# Patient Record
Sex: Female | Born: 1940 | Race: Asian | Hispanic: No | Marital: Married
Health system: Midwestern US, Community
[De-identification: ages and names within clinical notes are randomized; demographics above are authoritative.]

## PROBLEM LIST (undated history)

## (undated) ENCOUNTER — Emergency Department (HOSPITAL_BASED_OUTPATIENT_CLINIC_OR_DEPARTMENT_OTHER): Payer: BC Managed Care – PPO

## (undated) DIAGNOSIS — Z86010 Personal history of colonic polyps: Secondary | ICD-10-CM

## (undated) DIAGNOSIS — I700 Atherosclerosis of aorta: Secondary | ICD-10-CM

## (undated) DIAGNOSIS — N3946 Mixed incontinence: Secondary | ICD-10-CM

## (undated) DIAGNOSIS — N952 Postmenopausal atrophic vaginitis: Secondary | ICD-10-CM

## (undated) DIAGNOSIS — Q254 Congenital malformation of aorta unspecified: Secondary | ICD-10-CM

## (undated) DIAGNOSIS — Z8249 Family history of ischemic heart disease and other diseases of the circulatory system: Secondary | ICD-10-CM

## (undated) DIAGNOSIS — Z1231 Encounter for screening mammogram for malignant neoplasm of breast: Secondary | ICD-10-CM

## (undated) DIAGNOSIS — I712 Thoracic aortic aneurysm, without rupture, unspecified: Secondary | ICD-10-CM

## (undated) DIAGNOSIS — K2950 Unspecified chronic gastritis without bleeding: Secondary | ICD-10-CM

## (undated) DIAGNOSIS — M8589 Other specified disorders of bone density and structure, multiple sites: Secondary | ICD-10-CM

## (undated) DIAGNOSIS — K5900 Constipation, unspecified: Secondary | ICD-10-CM

## (undated) HISTORY — DX: Postmenopausal atrophic vaginitis: N95.2

## (undated) HISTORY — DX: Personal history of colonic polyps: Z86.010

## (undated) HISTORY — DX: Atherosclerosis of aorta: I70.0

## (undated) HISTORY — PX: EYE SURGERY: SHX253

## (undated) HISTORY — PX: ABDOMINAL HYSTERECTOMY: SHX81

## (undated) HISTORY — DX: Congenital malformation of aorta unspecified: Q25.40

## (undated) HISTORY — DX: Mixed incontinence: N39.46

## (undated) HISTORY — DX: Family history of ischemic heart disease and other diseases of the circulatory system: Z82.49

## (undated) HISTORY — PX: APPENDECTOMY: SHX54

## (undated) HISTORY — PX: BREAST EXCISIONAL BIOPSY: SUR124

---

## 2012-11-25 ENCOUNTER — Emergency Department (HOSPITAL_BASED_OUTPATIENT_CLINIC_OR_DEPARTMENT_OTHER): Payer: No Typology Code available for payment source

## 2012-11-25 ENCOUNTER — Emergency Department (HOSPITAL_BASED_OUTPATIENT_CLINIC_OR_DEPARTMENT_OTHER)
Admission: EM | Admit: 2012-11-25 | Discharge: 2012-11-26 | Disposition: A | Discharge status: Home / Self Care | Payer: No Typology Code available for payment source | Attending: Emergency Medicine | Admitting: Emergency Medicine

## 2012-11-25 ENCOUNTER — Encounter (HOSPITAL_BASED_OUTPATIENT_CLINIC_OR_DEPARTMENT_OTHER): Payer: Self-pay | Admitting: *Deleted

## 2012-11-25 DIAGNOSIS — Y92410 Unspecified street and highway as the place of occurrence of the external cause: Secondary | ICD-10-CM | POA: Insufficient documentation

## 2012-11-25 DIAGNOSIS — S060XAA Concussion with loss of consciousness status unknown, initial encounter: Secondary | ICD-10-CM | POA: Insufficient documentation

## 2012-11-25 DIAGNOSIS — Y9389 Activity, other specified: Secondary | ICD-10-CM | POA: Insufficient documentation

## 2012-11-25 DIAGNOSIS — S5010XA Contusion of unspecified forearm, initial encounter: Secondary | ICD-10-CM | POA: Insufficient documentation

## 2012-11-25 DIAGNOSIS — S060X9A Concussion with loss of consciousness of unspecified duration, initial encounter: Secondary | ICD-10-CM | POA: Insufficient documentation

## 2012-11-25 DIAGNOSIS — Z880 Allergy status to penicillin: Secondary | ICD-10-CM | POA: Insufficient documentation

## 2012-11-25 LAB — CBC WITH DIFFERENTIAL/PLATELET
Basophils Absolute: 0 10*3/uL (ref 0.0–0.1)
Basophils Relative: 1 % (ref 0–1)
Eosinophils Relative: 3 % (ref 0–5)
HCT: 41.3 % (ref 36.0–46.0)
Lymphocytes Relative: 40 % (ref 12–46)
MCHC: 34.4 g/dL (ref 30.0–36.0)
Monocytes Absolute: 0.4 10*3/uL (ref 0.1–1.0)
Neutro Abs: 2.2 10*3/uL (ref 1.7–7.7)
Platelets: 132 10*3/uL — ABNORMAL LOW (ref 150–400)
RDW: 12.6 % (ref 11.5–15.5)
WBC: 4.6 10*3/uL (ref 4.0–10.5)

## 2012-11-25 LAB — BASIC METABOLIC PANEL
CO2: 28 mEq/L (ref 19–32)
Calcium: 10.2 mg/dL (ref 8.4–10.5)
Chloride: 103 mEq/L (ref 96–112)
Creatinine, Ser: 0.7 mg/dL (ref 0.50–1.10)
GFR calc Af Amer: >90 mL/min (ref >90)
Sodium: 140 mEq/L (ref 135–145)

## 2012-11-25 LAB — URINE MICROSCOPIC-ADD ON

## 2012-11-25 LAB — URINALYSIS, ROUTINE W REFLEX MICROSCOPIC
Bilirubin Urine: NEGATIVE
Glucose, UA: NEGATIVE mg/dL
Hgb urine dipstick: NEGATIVE
Specific Gravity, Urine: 1.01 (ref 1.005–1.030)

## 2012-11-25 NOTE — ED Notes (Signed)
Patient transported to CT 
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
## 2012-11-27 LAB — URINE CULTURE

## 2013-12-29 ENCOUNTER — Other Ambulatory Visit: Payer: Self-pay

## 2013-12-29 DIAGNOSIS — Z1231 Encounter for screening mammogram for malignant neoplasm of breast: Secondary | ICD-10-CM

## 2014-02-21 ENCOUNTER — Encounter (INDEPENDENT_AMBULATORY_CARE_PROVIDER_SITE_OTHER): Payer: Self-pay

## 2014-02-21 ENCOUNTER — Ambulatory Visit
Admission: RE | Admit: 2014-02-21 | Discharge: 2014-02-21 | Disposition: A | Discharge status: Home / Self Care | Payer: Medicare Other | Source: Ambulatory Visit

## 2014-02-21 DIAGNOSIS — Z1231 Encounter for screening mammogram for malignant neoplasm of breast: Secondary | ICD-10-CM

## 2014-09-22 ENCOUNTER — Other Ambulatory Visit: Payer: Self-pay | Admitting: Physical Medicine and Rehabilitation

## 2014-09-22 ENCOUNTER — Ambulatory Visit
Admission: RE | Admit: 2014-09-22 | Discharge: 2014-09-22 | Disposition: A | Discharge status: Home / Self Care | Payer: Medicare Other | Source: Ambulatory Visit | Attending: Family Medicine | Admitting: Family Medicine

## 2014-09-22 ENCOUNTER — Other Ambulatory Visit: Payer: Self-pay | Admitting: Family Medicine

## 2014-09-22 DIAGNOSIS — R05 Cough: Secondary | ICD-10-CM

## 2014-09-22 DIAGNOSIS — R059 Cough, unspecified: Secondary | ICD-10-CM

## 2014-11-16 ENCOUNTER — Other Ambulatory Visit: Payer: Self-pay | Admitting: Family Medicine

## 2014-11-16 DIAGNOSIS — R0989 Other specified symptoms and signs involving the circulatory and respiratory systems: Secondary | ICD-10-CM

## 2014-11-17 ENCOUNTER — Other Ambulatory Visit: Payer: Self-pay | Admitting: Family Medicine

## 2014-11-17 DIAGNOSIS — Z8249 Family history of ischemic heart disease and other diseases of the circulatory system: Secondary | ICD-10-CM

## 2014-11-17 DIAGNOSIS — Z136 Encounter for screening for cardiovascular disorders: Secondary | ICD-10-CM

## 2014-11-25 ENCOUNTER — Ambulatory Visit
Admission: RE | Admit: 2014-11-25 | Discharge: 2014-11-25 | Disposition: A | Discharge status: Home / Self Care | Payer: Medicare Other | Source: Ambulatory Visit | Attending: Family Medicine | Admitting: Family Medicine

## 2014-11-25 DIAGNOSIS — R0989 Other specified symptoms and signs involving the circulatory and respiratory systems: Secondary | ICD-10-CM

## 2014-11-25 DIAGNOSIS — Z8249 Family history of ischemic heart disease and other diseases of the circulatory system: Secondary | ICD-10-CM

## 2014-11-25 DIAGNOSIS — Z136 Encounter for screening for cardiovascular disorders: Secondary | ICD-10-CM

## 2015-01-18 ENCOUNTER — Other Ambulatory Visit: Payer: Self-pay

## 2015-01-18 DIAGNOSIS — Z1231 Encounter for screening mammogram for malignant neoplasm of breast: Secondary | ICD-10-CM

## 2015-02-27 ENCOUNTER — Ambulatory Visit
Admission: RE | Admit: 2015-02-27 | Discharge: 2015-02-27 | Disposition: A | Discharge status: Home / Self Care | Payer: Medicare Other | Source: Ambulatory Visit

## 2015-02-27 DIAGNOSIS — Z1231 Encounter for screening mammogram for malignant neoplasm of breast: Secondary | ICD-10-CM

## 2015-09-29 DIAGNOSIS — H524 Presbyopia: Secondary | ICD-10-CM | POA: Diagnosis not present

## 2015-11-13 DIAGNOSIS — H5442 Blindness, left eye, normal vision right eye: Secondary | ICD-10-CM | POA: Diagnosis not present

## 2015-11-13 DIAGNOSIS — H40032 Anatomical narrow angle, left eye: Secondary | ICD-10-CM | POA: Diagnosis not present

## 2015-11-13 DIAGNOSIS — H40001 Preglaucoma, unspecified, right eye: Secondary | ICD-10-CM | POA: Diagnosis not present

## 2015-11-14 DIAGNOSIS — Z0000 Encounter for general adult medical examination without abnormal findings: Secondary | ICD-10-CM | POA: Diagnosis not present

## 2015-11-14 DIAGNOSIS — Z1389 Encounter for screening for other disorder: Secondary | ICD-10-CM | POA: Diagnosis not present

## 2015-11-14 DIAGNOSIS — Z86010 Personal history of colonic polyps: Secondary | ICD-10-CM | POA: Diagnosis not present

## 2016-01-16 DIAGNOSIS — K648 Other hemorrhoids: Secondary | ICD-10-CM | POA: Diagnosis not present

## 2016-01-16 DIAGNOSIS — Z86010 Personal history of colonic polyps: Secondary | ICD-10-CM | POA: Diagnosis not present

## 2016-01-16 DIAGNOSIS — D126 Benign neoplasm of colon, unspecified: Secondary | ICD-10-CM | POA: Diagnosis not present

## 2016-01-16 DIAGNOSIS — D125 Benign neoplasm of sigmoid colon: Secondary | ICD-10-CM | POA: Diagnosis not present

## 2016-02-12 ENCOUNTER — Other Ambulatory Visit: Payer: Self-pay | Admitting: Obstetrics and Gynecology

## 2016-02-12 DIAGNOSIS — Z1231 Encounter for screening mammogram for malignant neoplasm of breast: Secondary | ICD-10-CM

## 2016-02-13 DIAGNOSIS — M545 Low back pain: Secondary | ICD-10-CM | POA: Diagnosis not present

## 2016-02-13 DIAGNOSIS — S40029A Contusion of unspecified upper arm, initial encounter: Secondary | ICD-10-CM | POA: Diagnosis not present

## 2016-02-13 DIAGNOSIS — Z8249 Family history of ischemic heart disease and other diseases of the circulatory system: Secondary | ICD-10-CM | POA: Diagnosis not present

## 2016-03-11 ENCOUNTER — Ambulatory Visit: Payer: Medicare Other

## 2016-03-15 DIAGNOSIS — R300 Dysuria: Secondary | ICD-10-CM | POA: Diagnosis not present

## 2016-03-18 ENCOUNTER — Ambulatory Visit
Admission: RE | Admit: 2016-03-18 | Discharge: 2016-03-18 | Disposition: A | Discharge status: Home / Self Care | Payer: Medicare Other | Source: Ambulatory Visit | Attending: Obstetrics and Gynecology | Admitting: Obstetrics and Gynecology

## 2016-03-18 DIAGNOSIS — Z1231 Encounter for screening mammogram for malignant neoplasm of breast: Secondary | ICD-10-CM

## 2016-04-09 DIAGNOSIS — Z23 Encounter for immunization: Secondary | ICD-10-CM | POA: Diagnosis not present

## 2016-04-15 DIAGNOSIS — M25551 Pain in right hip: Secondary | ICD-10-CM | POA: Diagnosis not present

## 2016-04-19 DIAGNOSIS — M461 Sacroiliitis, not elsewhere classified: Secondary | ICD-10-CM | POA: Diagnosis not present

## 2016-05-28 DIAGNOSIS — R300 Dysuria: Secondary | ICD-10-CM | POA: Diagnosis not present

## 2016-05-28 DIAGNOSIS — N952 Postmenopausal atrophic vaginitis: Secondary | ICD-10-CM | POA: Diagnosis not present

## 2016-05-28 DIAGNOSIS — N3946 Mixed incontinence: Secondary | ICD-10-CM | POA: Diagnosis not present

## 2016-08-07 DIAGNOSIS — M461 Sacroiliitis, not elsewhere classified: Secondary | ICD-10-CM | POA: Diagnosis not present

## 2016-08-09 DIAGNOSIS — J069 Acute upper respiratory infection, unspecified: Secondary | ICD-10-CM | POA: Diagnosis not present

## 2016-08-13 DIAGNOSIS — J0100 Acute maxillary sinusitis, unspecified: Secondary | ICD-10-CM | POA: Diagnosis not present

## 2016-08-30 DIAGNOSIS — H40001 Preglaucoma, unspecified, right eye: Secondary | ICD-10-CM | POA: Diagnosis not present

## 2016-08-30 DIAGNOSIS — H40032 Anatomical narrow angle, left eye: Secondary | ICD-10-CM | POA: Diagnosis not present

## 2016-08-30 DIAGNOSIS — H5440 Blindness, one eye, unspecified eye: Secondary | ICD-10-CM | POA: Diagnosis not present

## 2016-08-30 DIAGNOSIS — H2513 Age-related nuclear cataract, bilateral: Secondary | ICD-10-CM | POA: Diagnosis not present

## 2016-09-11 DIAGNOSIS — M461 Sacroiliitis, not elsewhere classified: Secondary | ICD-10-CM | POA: Diagnosis not present

## 2016-09-23 DIAGNOSIS — R0989 Other specified symptoms and signs involving the circulatory and respiratory systems: Secondary | ICD-10-CM | POA: Diagnosis not present

## 2016-09-27 DIAGNOSIS — M5416 Radiculopathy, lumbar region: Secondary | ICD-10-CM | POA: Diagnosis not present

## 2016-10-02 DIAGNOSIS — H524 Presbyopia: Secondary | ICD-10-CM | POA: Diagnosis not present

## 2016-10-08 ENCOUNTER — Telehealth: Payer: Self-pay

## 2016-10-08 NOTE — Telephone Encounter (Signed)
NOTES SENT TO SCHEDULING.  °

## 2016-10-09 ENCOUNTER — Encounter (INDEPENDENT_AMBULATORY_CARE_PROVIDER_SITE_OTHER): Payer: Self-pay

## 2016-10-09 ENCOUNTER — Encounter: Payer: Self-pay | Admitting: Cardiovascular Disease

## 2016-10-09 ENCOUNTER — Other Ambulatory Visit: Payer: Self-pay | Admitting: Cardiovascular Disease

## 2016-10-09 ENCOUNTER — Ambulatory Visit (INDEPENDENT_AMBULATORY_CARE_PROVIDER_SITE_OTHER): Payer: Medicare Other | Admitting: Cardiovascular Disease

## 2016-10-09 VITALS — BP 120/70 | HR 61 | Ht 62.0 in | Wt 127.8 lb

## 2016-10-09 DIAGNOSIS — Z7689 Persons encountering health services in other specified circumstances: Secondary | ICD-10-CM

## 2016-10-09 DIAGNOSIS — I719 Aortic aneurysm of unspecified site, without rupture: Secondary | ICD-10-CM

## 2016-10-09 DIAGNOSIS — R0602 Shortness of breath: Secondary | ICD-10-CM | POA: Diagnosis not present

## 2016-10-09 DIAGNOSIS — Z79899 Other long term (current) drug therapy: Secondary | ICD-10-CM | POA: Diagnosis not present

## 2016-10-09 LAB — BASIC METABOLIC PANEL
BUN/Creatinine Ratio: 28 (ref 12–28)
BUN: 18 mg/dL (ref 8–27)
CO2: 24 mmol/L (ref 18–29)
CREATININE: 0.65 mg/dL (ref 0.57–1.00)
Calcium: 9.4 mg/dL (ref 8.7–10.3)
Chloride: 100 mmol/L (ref 96–106)
GFR calc Af Amer: 98 mL/min/{1.73_m2} (ref >59)
GFR calc non Af Amer: 85 mL/min/{1.73_m2} (ref >59)
Glucose: 89 mg/dL (ref 65–99)
Potassium: 4.6 mmol/L (ref 3.5–5.2)
SODIUM: 141 mmol/L (ref 134–144)

## 2016-10-09 NOTE — Patient Instructions (Addendum)
Medication Instructions:  Your physician recommends that you continue on your current medications as directed. Please refer to the Current Medication list given to you today.  Labwork: Your physician recommends that you have lab work today- BMET  Testing/Procedures: Your physician has requested that you have an echocardiogram. Echocardiography is a painless test that uses sound waves to create images of your heart. It provides your doctor with information about the size and shape of your heart and how well your heart's chambers and valves are working. This procedure takes approximately one hour. There are no restrictions for this procedure.  Your physician has requested that you have an abdominal aorta duplex. During this test, an ultrasound is used to evaluate the aorta. Allow 30 minutes for this exam. Do not eat after midnight the day before and avoid carbonated beverages  Cardiac CT scanning, (CAT scanning), is a noninvasive, special x-ray that produces cross-sectional images of the body using x-rays and a computer. CT scans help physicians diagnose and treat medical conditions. For some CT exams, a contrast material is used to enhance visibility in the area of the body being studied. CT scans provide greater clarity and reveal more details than regular x-ray exams.  Follow-Up: Your physician wants you to follow-up in: 6 months with Dr. Eden EmmsNishan. You will receive a reminder letter in the mail two months in advance. If you don't receive a letter, please call our office to schedule the follow-up appointment.   If you need a refill on your cardiac medications before your next appointment, please call your pharmacy.    1``

## 2016-10-09 NOTE — Progress Notes (Signed)
Cardiology Office Note   Date:  10/09/2016   ID:  Allison CrewsMyung J Riemenschneider, DOB 1941/03/10, MRN [id number]  PCP:  [facility] Dr Zachery DauerBarnes   Cardiologist:   Charlton HawsPeter Thaine Garriga, MD   Chief Complaint  Patient presents with  . Establish Care      History of Present Illness: Allison Johnston is a 76 y.o. female who presents for evaluation of aorta. CXR with aortic arch calcification Family history of aortic aneurysm. CXR Done for dyspnea. Also showed ? COPD She indicates having two sisters with ? AAA one  Got a stent. She is a non smoker with no known history of vascular disease. She is active Goes to [employer] and exercises/swims daily.  No chest pain palpitations or syncope  She had a bout of MAI Rx with antibiotics with chronic sputum and cough.   Some exertional dyspnea no worsening over last year    Past Medical History:  Diagnosis Date  . Aortic arch anomaly   . Family history of thoracic aortic aneurysm   . 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 Hx of colonic polyps   . Mixed stress and urge urinary incontinence   . Vaginal atrophy     Past Surgical History:  Procedure Laterality Date  . ABDOMINAL HYSTERECTOMY    . APPENDECTOMY    . EYE SURGERY       Current Outpatient Prescriptions  Medication Sig Dispense Refill  . Calcium Carbonate-Vitamin D 600-200 MG-UNIT CAPS Take 1 tablet by mouth 2 (two) times daily.    . cevimeline (EVOXAC) 30 MG capsule Take 30 mg by mouth 3 (three) times daily.    . Cholecalciferol (VITAMIN D) 2000 units CAPS Take 2,000 Units by mouth daily.    . Estradiol 10 MCG TABS vaginal tablet Place vaginally 2 (two) times a week.    Marland Kitchen. FIBER PO Take by mouth daily.    . Flaxseed, Linseed, (FLAX SEED OIL PO) Take by mouth 2 (two) times daily.    . Multiple Vitamin (MULTIVITAMIN) tablet Take 1 tablet by mouth daily.    . Multiple Vitamins-Minerals (HAIR SKIN AND NAILS FORMULA PO) Take by mouth 2 (two) times daily.    . Omega-3 Fatty  Acids (FISH OIL) 1000 MG CAPS Take 2,000 mcg by mouth 2 (two) times daily.     No current facility-administered medications for this visit.     Allergies:   Penicillins; Azithromycin; Amoxicillin; Erythromycin; and Sulfa antibiotics    Social History:  The patient  reports that she has never smoked. She has never used smokeless tobacco. She reports that she does not drink alcohol or use drugs.   Family History:  The patient's family history is not on file.    ROS:  Please see the history of present illness.   Otherwise, review of systems are positive for none.   All other systems are reviewed and negative.    PHYSICAL EXAM: VS:  BP 120/70   Pulse 61   Ht 5\' 2"  (1.575 m)   Wt 127 lb 12.8 oz (58 kg)   SpO2 97%   BMI 23.37 kg/m  , BMI Body mass index is 23.37 kg/m. Affect appropriate Healthy:  appears stated age HEENT: normal Neck supple with no adenopathy JVP normal no bruits no thyromegaly Lungs clear with no wheezing and good diaphragmatic motion Heart:  S1/S2 no murmur, no rub, gallop or click PMI normal Abdomen: benighn, BS positve,  no tenderness,  Abdominal aorta palpable not tender  no bruit.  No HSM or HJR Distal pulses intact with no bruits No edema Neuro non-focal Skin warm and dry No muscular weakness    EKG:  SR rate 56 normal ECG    Recent Labs: No results found for requested labs within last 8760 hours.    Lipid Panel No results found for: CHOL, TRIG, HDL, CHOLHDL, VLDL, LDLCALC, LDLDIRECT    Wt Readings from Last 3 Encounters:  10/09/16 127 lb 12.8 oz (58 kg)  11/25/12 120 lb (54.4 kg)      Other studies Reviewed: Additional studies/ records that were reviewed today include: Notes from Dr Zachery Dauer CXR .    ASSESSMENT AND PLAN:  1. MAI:  CXR with ? COPD active no wheezing cough related f/u primary 2. Family history Aneurysm : with calcification on CXR will order chest CTA And abdominal US to look at aorta. Calcium may be just related to  age 11. Edema:  Mild dependent low sodium diet elevated at end of day  4. Dyspnea:  F/u echo suspect due to previous chronic lung infection   Current medicines are reviewed at length with the patient today.  The patient does not have concerns regarding medicines.  The following changes have been made:  no change  Labs/ tests ordered today include: CTA Chest, Abdominal US Echo  No orders of the defined types were placed in this encounter.    Disposition:   FU with in a year      Signed, Charlton Haws, MD  10/09/2016 11:29 AM    [facility] [address] [place], [place], [place]  [address] Phone: [phone]; Fax: [phone]

## 2016-10-11 ENCOUNTER — Ambulatory Visit (INDEPENDENT_AMBULATORY_CARE_PROVIDER_SITE_OTHER)
Admission: RE | Admit: 2016-10-11 | Discharge: 2016-10-11 | Disposition: A | Discharge status: Home / Self Care | Payer: Medicare Other | Source: Ambulatory Visit | Attending: Cardiovascular Disease | Admitting: Cardiovascular Disease

## 2016-10-11 DIAGNOSIS — I719 Aortic aneurysm of unspecified site, without rupture: Secondary | ICD-10-CM

## 2016-10-11 DIAGNOSIS — R079 Chest pain, unspecified: Secondary | ICD-10-CM | POA: Diagnosis not present

## 2016-10-11 MED ORDER — IOPAMIDOL (ISOVUE-370) INJECTION 76%
75.0000 mL | Freq: Once | INTRAVENOUS | Status: AC | PRN
  Administered 2016-10-11: 75 mL via INTRAVENOUS

## 2016-10-15 DIAGNOSIS — M5416 Radiculopathy, lumbar region: Secondary | ICD-10-CM | POA: Diagnosis not present

## 2016-10-24 ENCOUNTER — Ambulatory Visit (HOSPITAL_COMMUNITY): Payer: Medicare Other | Attending: Cardiovascular Disease

## 2016-10-24 ENCOUNTER — Other Ambulatory Visit: Payer: Self-pay

## 2016-10-24 DIAGNOSIS — R600 Localized edema: Secondary | ICD-10-CM | POA: Insufficient documentation

## 2016-10-24 DIAGNOSIS — I351 Nonrheumatic aortic (valve) insufficiency: Secondary | ICD-10-CM | POA: Insufficient documentation

## 2016-10-24 DIAGNOSIS — R0602 Shortness of breath: Secondary | ICD-10-CM

## 2016-10-24 DIAGNOSIS — I719 Aortic aneurysm of unspecified site, without rupture: Secondary | ICD-10-CM

## 2016-11-06 ENCOUNTER — Inpatient Hospital Stay (HOSPITAL_COMMUNITY): Admission: RE | Admit: 2016-11-06 | Payer: Medicare Other | Source: Ambulatory Visit

## 2016-11-13 DIAGNOSIS — Z136 Encounter for screening for cardiovascular disorders: Secondary | ICD-10-CM | POA: Diagnosis not present

## 2016-11-13 DIAGNOSIS — Z0000 Encounter for general adult medical examination without abnormal findings: Secondary | ICD-10-CM | POA: Diagnosis not present

## 2016-11-15 DIAGNOSIS — Z1389 Encounter for screening for other disorder: Secondary | ICD-10-CM | POA: Diagnosis not present

## 2016-11-15 DIAGNOSIS — Z0000 Encounter for general adult medical examination without abnormal findings: Secondary | ICD-10-CM | POA: Diagnosis not present

## 2016-11-15 DIAGNOSIS — Z8249 Family history of ischemic heart disease and other diseases of the circulatory system: Secondary | ICD-10-CM | POA: Diagnosis not present

## 2016-11-15 DIAGNOSIS — M8588 Other specified disorders of bone density and structure, other site: Secondary | ICD-10-CM | POA: Diagnosis not present

## 2016-11-16 DIAGNOSIS — M5416 Radiculopathy, lumbar region: Secondary | ICD-10-CM | POA: Diagnosis not present

## 2016-11-21 DIAGNOSIS — M5416 Radiculopathy, lumbar region: Secondary | ICD-10-CM | POA: Diagnosis not present

## 2016-11-29 ENCOUNTER — Ambulatory Visit (HOSPITAL_COMMUNITY)
Admission: RE | Admit: 2016-11-29 | Discharge: 2016-11-29 | Disposition: A | Discharge status: Home / Self Care | Payer: Medicare Other | Source: Ambulatory Visit | Attending: Cardiovascular Disease | Admitting: Cardiovascular Disease

## 2016-11-29 DIAGNOSIS — I719 Aortic aneurysm of unspecified site, without rupture: Secondary | ICD-10-CM | POA: Insufficient documentation

## 2016-11-29 DIAGNOSIS — Z8249 Family history of ischemic heart disease and other diseases of the circulatory system: Secondary | ICD-10-CM | POA: Diagnosis not present

## 2016-11-29 DIAGNOSIS — I714 Abdominal aortic aneurysm, without rupture: Secondary | ICD-10-CM | POA: Diagnosis not present

## 2016-12-10 ENCOUNTER — Telehealth: Payer: Self-pay | Admitting: Cardiovascular Disease

## 2016-12-10 DIAGNOSIS — M5416 Radiculopathy, lumbar region: Secondary | ICD-10-CM | POA: Diagnosis not present

## 2016-12-10 NOTE — Telephone Encounter (Signed)
New message    Pt calling back for testing results

## 2016-12-10 NOTE — Telephone Encounter (Signed)
Called patient back with AAA results.

## 2017-02-07 ENCOUNTER — Other Ambulatory Visit: Payer: Self-pay | Admitting: Obstetrics and Gynecology

## 2017-02-07 DIAGNOSIS — Z1231 Encounter for screening mammogram for malignant neoplasm of breast: Secondary | ICD-10-CM

## 2017-02-19 DIAGNOSIS — M5416 Radiculopathy, lumbar region: Secondary | ICD-10-CM | POA: Diagnosis not present

## 2017-03-03 DIAGNOSIS — M8588 Other specified disorders of bone density and structure, other site: Secondary | ICD-10-CM | POA: Diagnosis not present

## 2017-03-19 ENCOUNTER — Ambulatory Visit
Admission: RE | Admit: 2017-03-19 | Discharge: 2017-03-19 | Disposition: A | Discharge status: Home / Self Care | Payer: Medicare Other | Source: Ambulatory Visit | Attending: Obstetrics and Gynecology | Admitting: Obstetrics and Gynecology

## 2017-03-19 DIAGNOSIS — Z1231 Encounter for screening mammogram for malignant neoplasm of breast: Secondary | ICD-10-CM | POA: Diagnosis not present

## 2017-04-15 ENCOUNTER — Ambulatory Visit: Payer: Medicare Other

## 2017-04-15 DIAGNOSIS — Z23 Encounter for immunization: Secondary | ICD-10-CM | POA: Diagnosis not present

## 2017-05-20 NOTE — Progress Notes (Signed)
Cardiology Office Note   Date:  05/28/2017   ID:  Allison Johnston, Allison Johnston 1941/05/25, MRN [id number]  PCP:  [facility] Dr Zachery Dauer   Cardiologist:   Charlton Haws, MD   No chief complaint on file.     History of Present Illness: Allison Johnston is a 76 y.o. female with family history of aneurysmal disease and chronic lung disease She indicates having two sisters with ? AAA one Got a stent. She is a non smoker with no known history of vascular disease. She is active Goes to [employer] and exercises/swims daily.  No chest pain palpitations or syncope   She had a bout of MAI Rx with antibiotics with chronic sputum and cough.   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 Has two children daughter in [place] And son MD in [place] with two grandchildren     Past Medical History:  Diagnosis Date  . Aortic arch anomaly   . Family history of thoracic aortic aneurysm   . 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 Hx of colonic polyps   . Mixed stress and urge urinary incontinence   . Vaginal atrophy     Past Surgical History:  Procedure Laterality Date  . ABDOMINAL HYSTERECTOMY    . APPENDECTOMY    . EYE SURGERY       Current Outpatient Prescriptions  Medication Sig Dispense Refill  . Calcium Carbonate-Vitamin D 600-200 MG-UNIT CAPS Take 1 tablet by mouth 2 (two) times daily.    . cevimeline (EVOXAC) 30 MG capsule Take 30 mg by mouth 3 (three) times daily.    . Cholecalciferol (VITAMIN D) 2000 units CAPS Take 2,000 Units by mouth daily.    . Estradiol 10 MCG TABS vaginal tablet Place vaginally 2 (two) times a week.    Marland Kitchen FIBER PO Take by mouth daily.    . Flaxseed, Linseed, (FLAX SEED OIL PO) Take by mouth 2 (two) times daily.    . Multiple Vitamin (MULTIVITAMIN) tablet Take 1 tablet  by mouth daily.    . Multiple Vitamins-Minerals (HAIR SKIN AND NAILS FORMULA PO) Take by mouth 2 (two) times daily.    . Omega-3 Fatty Acids (FISH OIL) 1000 MG CAPS Take 2,000 mcg by mouth 2 (two) times daily.    . Turmeric 500 MG CAPS Take 500 mg by mouth daily.     No current facility-administered medications for this visit.     Allergies:   Penicillins; Azithromycin; Amoxicillin; Erythromycin; and Sulfa antibiotics    Social History:  The patient  reports that she has never smoked. She has never used smokeless tobacco. She reports that she does not drink alcohol or use drugs.   Family History:  The patient's family history is not on file.    ROS:  Please see the history of present illness.   Otherwise, review of systems are positive for none.   All other systems are reviewed and negative.    PHYSICAL EXAM: VS:  BP 122/64   Pulse (!) 59   Ht  (1.549 m)   Wt 124 lb 8 oz (56.5 kg)   BMI 23.52 kg/m  , BMI Body mass index is 23.52 kg/m. Affect appropriate Healthy:  appears  stated age HEENT: normal Neck supple with no adenopathy JVP normal no bruits no thyromegaly Lungs clear with no wheezing and good diaphragmatic motion Heart:  S1/S2 no murmur, no rub, gallop or click PMI normal Abdomen: benighn, BS positve, no tenderness,  Abdominal aorta palpable not tender  no bruit.  No HSM or HJR Distal pulses intact with no bruits No edema Neuro non-focal Skin warm and dry No muscular weakness    EKG:  SR rate 56 normal ECG    Recent Labs: 10/09/2016: BUN 18; Creatinine, Ser 0.65; Potassium 4.6; Sodium 141    Lipid Panel No results found for: CHOL, TRIG, HDL, CHOLHDL, VLDL, LDLCALC, LDLDIRECT    Wt Readings from Last 3 Encounters:  05/28/17 124 lb 8 oz (56.5 kg)  10/09/16 127 lb 12.8 oz (58 kg)  11/25/12 120 lb (54.4 kg)      Other studies Reviewed: Additional studies/ records that were reviewed today include: Notes from Dr Zachery Dauer CXR .    ASSESSMENT AND  PLAN:  1. MAI:  CXR with ? COPD active no wheezing cough related f/u primary 2. Family history Aneurysm :  CTA 10/11/16 Ao 4.0 cm no AAA by duplex  And abdominal US to look at aorta. Calcium may be just related to age 47. Edema:  Mild dependent low sodium diet elevated at end of day  4. Dyspnea:  Related to lung disease echo 10/24/16 EF normal  5. AR: mild not evident on exam observe    Current medicines are reviewed at length with the patient today.  The patient does not have concerns regarding medicines.  The following changes have been made:  no change  Labs/ tests ordered today include: CTA Chest, Abdominal US Echo  No orders of the defined types were placed in this encounter.    Disposition:   FU with in a year      Signed, Charlton Haws, MD  05/28/2017 11:28 AM    [facility] [address] [place], [place], [place]  [address] Phone: [phone]; Fax: [phone]

## 2017-05-27 DIAGNOSIS — N952 Postmenopausal atrophic vaginitis: Secondary | ICD-10-CM | POA: Diagnosis not present

## 2017-05-27 DIAGNOSIS — Z01411 Encounter for gynecological examination (general) (routine) with abnormal findings: Secondary | ICD-10-CM | POA: Diagnosis not present

## 2017-05-28 ENCOUNTER — Encounter: Payer: Self-pay | Admitting: Cardiovascular Disease

## 2017-05-28 ENCOUNTER — Ambulatory Visit (INDEPENDENT_AMBULATORY_CARE_PROVIDER_SITE_OTHER): Payer: Medicare Other | Admitting: Cardiovascular Disease

## 2017-05-28 VITALS — BP 122/64 | HR 59 | Ht 61.0 in | Wt 124.5 lb

## 2017-05-28 DIAGNOSIS — I719 Aortic aneurysm of unspecified site, without rupture: Secondary | ICD-10-CM | POA: Diagnosis not present

## 2017-05-28 NOTE — Patient Instructions (Addendum)
Medication Instructions:  Your physician recommends that you continue on your current medications as directed. Please refer to the Current Medication list given to you today.  Labwork: Your physician recommends that you return for lab work in: March for a BMET before CT test   Testing/Procedures: Your physician has requested that you have cardiac CT in March. Cardiac computed tomography (CT) is a painless test that uses an x-ray machine to take clear, detailed pictures of your heart. For further information please visit [email or web address]. Please follow instruction sheet as given.  Follow-Up: Your physician wants you to follow-up in: March with Dr. Eden EmmsNishan after CT. You will receive a reminder letter in the mail two months in advance. If you don't receive a letter, please call our office to schedule the follow-up appointment.   If you need a refill on your cardiac medications before your next appointment, please call your pharmacy.  Please [facility] the [facility] at xx:xx AM (30-45 minutes prior to test start time)  [facility] [address] [place], [place] [address] [phone]  Proceed to the [facility] Radiology Department (First Floor).  Please follow these instructions carefully (unless otherwise directed):  Hold all erectile dysfunction medications at least 48 hours prior to test.  On the Night Before the Test: . Drink plenty of water. . Do not consume any caffeinated/decaffeinated beverages or chocolate 12 hours prior to your test. . Do not take any antihistamines 12 hours prior to your test.  On the Day of the Test: . Drink plenty of water. Do not drink any water within one hour of the test. . Do not eat any food 4 hours prior to the test. . You may take your regular medications prior to the test.  After the Test: . Drink plenty of water. . After receiving IV contrast, you may experience a mild flushed feeling.  This is normal. . On occasion, you may experience a mild rash up to 24 hours after the test. This is not dangerous. If this occurs, you can take Benadryl 25 mg and increase your fluid intake. . If you experience trouble breathing, this can be serious. If it is severe call 911 IMMEDIATELY. If it is mild, please call our office.

## 2017-07-03 ENCOUNTER — Encounter: Payer: Self-pay | Admitting: Cardiovascular Disease

## 2017-08-06 ENCOUNTER — Telehealth: Payer: Self-pay

## 2017-08-06 ENCOUNTER — Other Ambulatory Visit: Payer: Self-pay

## 2017-08-06 DIAGNOSIS — I719 Aortic aneurysm of unspecified site, without rupture: Secondary | ICD-10-CM

## 2017-08-06 DIAGNOSIS — I714 Abdominal aortic aneurysm, without rupture, unspecified: Secondary | ICD-10-CM

## 2017-08-06 NOTE — Addendum Note (Signed)
Addended by: Virl AxePATE INGALLS, Magan Winnett L on: 08/06/2017 03:08 PM   Modules accepted: Orders

## 2017-08-06 NOTE — Progress Notes (Signed)
Patient needs order for CTA for March prior to her appt. Ordered test. Will send message to schedule to help with appointment times.

## 2017-08-06 NOTE — Telephone Encounter (Signed)
Patient needs order for CTA for March prior to her appt. Ordered test. Will send message to schedule to help with appointment times. Left message for patient to call back.

## 2017-08-07 DIAGNOSIS — I719 Aortic aneurysm of unspecified site, without rupture: Secondary | ICD-10-CM | POA: Insufficient documentation

## 2017-08-11 DIAGNOSIS — Z23 Encounter for immunization: Secondary | ICD-10-CM | POA: Diagnosis not present

## 2017-08-11 DIAGNOSIS — L989 Disorder of the skin and subcutaneous tissue, unspecified: Secondary | ICD-10-CM | POA: Diagnosis not present

## 2017-08-11 DIAGNOSIS — L853 Xerosis cutis: Secondary | ICD-10-CM | POA: Diagnosis not present

## 2017-08-13 ENCOUNTER — Other Ambulatory Visit: Payer: Medicare Other

## 2017-08-13 DIAGNOSIS — H6123 Impacted cerumen, bilateral: Secondary | ICD-10-CM | POA: Diagnosis not present

## 2017-08-18 ENCOUNTER — Ambulatory Visit (HOSPITAL_COMMUNITY): Payer: Medicare Other

## 2017-08-19 NOTE — Telephone Encounter (Signed)
Patient has appt in March for BMET, CT, and office visit.

## 2017-09-01 DIAGNOSIS — J040 Acute laryngitis: Secondary | ICD-10-CM | POA: Diagnosis not present

## 2017-09-01 DIAGNOSIS — L309 Dermatitis, unspecified: Secondary | ICD-10-CM | POA: Diagnosis not present

## 2017-09-29 ENCOUNTER — Ambulatory Visit: Payer: Medicare Other | Admitting: Cardiovascular Disease

## 2017-10-03 DIAGNOSIS — H52221 Regular astigmatism, right eye: Secondary | ICD-10-CM | POA: Diagnosis not present

## 2017-10-09 ENCOUNTER — Other Ambulatory Visit: Payer: Medicare Other

## 2017-10-09 ENCOUNTER — Other Ambulatory Visit: Payer: Self-pay

## 2017-10-09 DIAGNOSIS — I719 Aortic aneurysm of unspecified site, without rupture: Secondary | ICD-10-CM

## 2017-10-10 LAB — BASIC METABOLIC PANEL
BUN/Creatinine Ratio: 25 (ref 12–28)
BUN: 18 mg/dL (ref 8–27)
CO2: 27 mmol/L (ref 20–29)
CREATININE: 0.72 mg/dL (ref 0.57–1.00)
Calcium: 9.5 mg/dL (ref 8.7–10.3)
Chloride: 102 mmol/L (ref 96–106)
GFR calc Af Amer: 92 mL/min/{1.73_m2} (ref >59)
GFR calc non Af Amer: 80 mL/min/{1.73_m2} (ref >59)
GLUCOSE: 84 mg/dL (ref 65–99)
Potassium: 4.4 mmol/L (ref 3.5–5.2)
Sodium: 142 mmol/L (ref 134–144)

## 2017-10-16 ENCOUNTER — Ambulatory Visit (INDEPENDENT_AMBULATORY_CARE_PROVIDER_SITE_OTHER)
Admission: RE | Admit: 2017-10-16 | Discharge: 2017-10-16 | Disposition: A | Discharge status: Home / Self Care | Payer: Medicare Other | Source: Ambulatory Visit | Attending: Cardiovascular Disease | Admitting: Cardiovascular Disease

## 2017-10-16 DIAGNOSIS — I700 Atherosclerosis of aorta: Secondary | ICD-10-CM | POA: Diagnosis not present

## 2017-10-16 DIAGNOSIS — I719 Aortic aneurysm of unspecified site, without rupture: Secondary | ICD-10-CM

## 2017-10-16 DIAGNOSIS — I712 Thoracic aortic aneurysm, without rupture: Secondary | ICD-10-CM | POA: Diagnosis not present

## 2017-10-16 MED ORDER — IOPAMIDOL (ISOVUE-370) INJECTION 76%
100.0000 mL | Freq: Once | INTRAVENOUS | Status: AC | PRN
  Administered 2017-10-16: 100 mL via INTRAVENOUS

## 2017-10-18 NOTE — Progress Notes (Signed)
Cardiology Office Note   Date:  10/20/2017   ID:  Allison Johnston, DOB [birth year], MRN [id number]  PCP:  [facility] Dr Allison Johnston   Cardiologist:   Charlton Haws, MD   No chief complaint on file.     History of Present Illness:  77 y.o. familial history of aneurysmal disease Chronic lung disease Two sisters with AAA one got stented. 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 Has two children daughter in [place] And son MD in [place] with two grandchildren   No cardiac complaints   Past Medical History:  Diagnosis Date  . Aortic arch anomaly   . Family history of thoracic aortic aneurysm   . 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 Hx of colonic polyps   . Mixed stress and urge urinary incontinence   . Vaginal atrophy     Past Surgical History:  Procedure Laterality Date  . ABDOMINAL HYSTERECTOMY    . APPENDECTOMY    . EYE SURGERY       Current Outpatient Medications  Medication Sig Dispense Refill  . Calcium Carbonate-Vitamin D 600-200 MG-UNIT CAPS Take 1 tablet by mouth 2 (two) times daily.    . Cholecalciferol (VITAMIN D) 2000 units CAPS Take 2,000 Units by mouth daily.    . Estradiol 10 MCG TABS vaginal tablet Place vaginally 2 (two) times a week.    Marland Kitchen FIBER PO Take by mouth as directed.     . Flaxseed, Linseed, (FLAX SEED OIL PO) Take by mouth 2 (two) times daily.    . Multiple Vitamin (MULTIVITAMIN) tablet Take 1 tablet by mouth daily.    . Multiple Vitamins-Minerals (HAIR SKIN AND NAILS FORMULA PO) Take by mouth 2 (two) times daily.    . Omega-3 Fatty Acids (FISH OIL) 1000 MG CAPS Take 2,000 mcg by mouth 2 (two) times daily.    . Turmeric 500 MG CAPS Take 500 mg by mouth  daily.     No current facility-administered medications for this visit.     Allergies:   Penicillins; Azithromycin; Amoxicillin; Erythromycin; and Sulfa antibiotics    Social History:  The patient  reports that  has never smoked. she has never used smokeless tobacco. She reports that she does not drink alcohol or use drugs.   Family History:  The patient's family history is not on file.    ROS:  Please see the history of present illness.   Otherwise, review of systems are positive for none.   All other systems are reviewed and negative.    PHYSICAL EXAM: BP 118/70   Ht 5\' 2"  (1.575 m)   Wt 125 lb 8 oz (56.9 kg)   BMI 22.95 kg/m  Affect appropriate Thin elderly chinese female  HEENT: normal Neck supple with no adenopathy JVP normal no bruits no thyromegaly Lungs clear with no wheezing and good diaphragmatic motion Heart:  S1/S2 no murmur, no rub, gallop or click PMI normal Abdomen: benighn, BS positve, no tenderness, no AAA no bruit.  No HSM or HJR Distal pulses intact with no bruits No edema Neuro non-focal Skin warm and dry No muscular weakness    EKG:  SR rate 56 normal ECG    Recent Labs: 10/09/2017: BUN 18; Creatinine, Ser 0.72; Potassium 4.4; Sodium 142    Lipid Panel No results found for: CHOL, TRIG, HDL, CHOLHDL, VLDL, LDLCALC, LDLDIRECT    Wt Readings from Last 3 Encounters:  10/20/17 125 lb 8 oz (56.9 kg)  05/28/17 124 lb 8 oz (56.5 kg)  10/09/16 127 lb 12.8 oz (58 kg)      Other studies Reviewed: Additional studies/ records that were reviewed today include: Notes from Dr Allison DauerBarnes CXR .    ASSESSMENT AND PLAN:  1. MAI:  CT chronic changes f/u pulmonary  2. Family history Aneurysm :  CTA 10/11/17  Ao 4.0 cm no AAA by duplex stable 3. Edema:  Mild dependent low sodium diet elevated at end of day  4. Dyspnea:  Related to lung disease echo 10/24/16 EF normal  5. AR: mild not evident on exam observe   6. Thyroid: 12 mm right thyroid nodule f/u  primary   Current medicines are reviewed at length with the patient today.  The patient does not have concerns regarding medicines.  The following changes have been made:  no change  Labs/ tests ordered today include: CTA Chest, Abdominal US Echo  No orders of the defined types were placed in this encounter.    Disposition:   FU with in a year      Signed, Charlton Hawseter Kincaid Tiger, MD  10/20/2017 11:48 AM    [facility] [address], [place], [place]  [address] Phone: [phone]; Fax: [phone]

## 2017-10-20 ENCOUNTER — Ambulatory Visit: Payer: Medicare Other | Admitting: Cardiovascular Disease

## 2017-10-20 ENCOUNTER — Encounter: Payer: Self-pay | Admitting: Cardiovascular Disease

## 2017-10-20 VITALS — BP 118/70 | Ht 62.0 in | Wt 125.5 lb

## 2017-10-20 DIAGNOSIS — I719 Aortic aneurysm of unspecified site, without rupture: Secondary | ICD-10-CM

## 2017-10-20 NOTE — Patient Instructions (Addendum)

## 2017-11-14 DIAGNOSIS — Z0000 Encounter for general adult medical examination without abnormal findings: Secondary | ICD-10-CM | POA: Diagnosis not present

## 2017-11-14 DIAGNOSIS — Z8249 Family history of ischemic heart disease and other diseases of the circulatory system: Secondary | ICD-10-CM | POA: Diagnosis not present

## 2017-11-14 DIAGNOSIS — Z136 Encounter for screening for cardiovascular disorders: Secondary | ICD-10-CM | POA: Diagnosis not present

## 2017-11-17 DIAGNOSIS — R7989 Other specified abnormal findings of blood chemistry: Secondary | ICD-10-CM | POA: Diagnosis not present

## 2017-11-17 DIAGNOSIS — Z0000 Encounter for general adult medical examination without abnormal findings: Secondary | ICD-10-CM | POA: Diagnosis not present

## 2017-11-17 DIAGNOSIS — Z1389 Encounter for screening for other disorder: Secondary | ICD-10-CM | POA: Diagnosis not present

## 2017-12-24 ENCOUNTER — Ambulatory Visit (INDEPENDENT_AMBULATORY_CARE_PROVIDER_SITE_OTHER)
Admission: RE | Admit: 2017-12-24 | Discharge: 2017-12-24 | Disposition: A | Discharge status: Home / Self Care | Payer: Medicare Other | Source: Ambulatory Visit | Attending: Internal Medicine | Admitting: Internal Medicine

## 2017-12-24 ENCOUNTER — Encounter: Payer: Self-pay | Admitting: Internal Medicine

## 2017-12-24 ENCOUNTER — Telehealth: Payer: Self-pay | Admitting: Internal Medicine

## 2017-12-24 ENCOUNTER — Ambulatory Visit: Payer: Medicare Other | Admitting: Internal Medicine

## 2017-12-24 VITALS — BP 102/70 | HR 70 | Ht 62.0 in | Wt 125.4 lb

## 2017-12-24 DIAGNOSIS — R918 Other nonspecific abnormal finding of lung field: Secondary | ICD-10-CM

## 2017-12-24 NOTE — Telephone Encounter (Signed)
Called and spoke to [facility] with [facility] regarding record request. [facility] states that [facility] switched to epic 05/201/7, and pt has not been seen since transferring over to epic.   [facility] stated she has been unable to access old system today, however when she is able to access older recorders she will fax them over.  Routing to Britt and MW as an Financial planner.

## 2017-12-24 NOTE — Patient Instructions (Signed)
We will try to get your records from [facility]   No treatment is indicated for MAI unless there are convincing symptoms that warrant : weight loss/ night sweats, productive cough lasting more at a few weeks> call for appt    Please remember to go to the  x-ray department downstairs in the basement  for your tests - we will call you with the results when they are available.

## 2017-12-24 NOTE — Progress Notes (Signed)
Subjective:     Patient ID: Allison Johnston, female   DOB: 07-04-1941, 77 y.o.   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 Pulmonary Consult    Referred by Dr. Zachery Dauer for eval of abnormal ct chest. She states ct was done to eval  aortic aneurysm. She denies any respiratory co's. She states she was txed for [facility] approx 5 years ago.   presently no cough/ no wt loss/ no sweats / Not limited by breathing from desired activities    No obvious day to day or daytime variability or assoc excess/ purulent sputum or mucus plugs or hemoptysis or cp or chest tightness, subjective wheeze or overt sinus or hb symptoms. No unusual exposure hx or h/o childhood pna/ asthma or knowledge of premature birth.  Sleeping  Ok   without nocturnal  or early am exacerbation  of respiratory  c/o's or need for noct saba. Also denies any obvious fluctuation of symptoms with weather or environmental changes or other aggravating or alleviating factors except as outlined above   Current Allergies, Complete Past Medical History, Past Surgical History, Family History, and Social History were reviewed in [employer] record.  ROS  The following are not active complaints unless bolded Hoarseness, sore throat, dysphagia, dental problems, itching, sneezing,  nasal congestion or discharge of excess mucus or purulent secretions, ear ache,   fever, chills, sweats, unintended wt loss or wt gain, classically pleuritic or exertional cp,  orthopnea pnd or arm/hand swelling  or leg swelling, presyncope, palpitations, abdominal pain, anorexia, nausea, vomiting, diarrhea  or change in bowel habits or change in bladder habits, change in stools or change  in urine, dysuria, hematuria,  rash, arthralgias, visual complaints, headache, numbness, weakness or ataxia or problems with walking or coordination,  change in mood or  memory.        Current Meds  Medication Sig  . Calcium Carbonate-Vitamin D 600-200 MG-UNIT CAPS Take 1 tablet by mouth 2 (two) times daily.  . Cholecalciferol (VITAMIN D) 2000 units CAPS Take 2,000 Units by mouth daily.  . Estradiol 10 MCG TABS vaginal tablet Place vaginally 2 (two) times a week.  Marland Kitchen FIBER PO Take by mouth as directed.   . Flaxseed, Linseed, (FLAX SEED OIL PO) Take by mouth 2 (two) times daily.  . Multiple Vitamin (MULTIVITAMIN) tablet Take 1 tablet by mouth daily.  . Multiple Vitamins-Minerals (HAIR SKIN AND NAILS FORMULA PO) Take by mouth 2 (two) times daily.  . Omega-3 Fatty Acids (FISH OIL) 1000 MG CAPS Take 2,000 mcg by mouth 2 (two) times daily.  . Turmeric 500 MG CAPS Take 500 mg by mouth daily.        Review of Systems     Objective:   Physical Exam   Stoic very pleasant  asian female nad   Wt Readings from Last 3 Encounters:  12/24/17 125 lb 6.4 oz (56.9 kg)  10/20/17 125 lb 8 oz (56.9 kg)  05/28/17 124 lb 8 oz (56.5 kg)     Vital signs reviewed - Note on arrival 02 sats  98% on RA     HEENT: nl dentition, turbinates bilaterally, and oropharynx.  Nl external ear canals without cough reflex   NECK :  without JVD/Nodes/TM/ nl carotid upstrokes bilaterally   LUNGS: no acc muscle use,  Nl contour chest which is clear to A and P bilaterally without cough on insp or exp maneuvers   CV:  RRR  no s3 or murmur or increase in P2, and no edema   ABD:  soft and nontender with nl inspiratory excursion in the supine position. No bruits or organomegaly appreciated, bowel sounds nl  MS:  Nl gait/ ext warm without deformities, calf tenderness, cyanosis or clubbing No obvious joint restrictions   SKIN: warm and dry without lesions    NEURO:  alert, approp, nl sensorium with  no motor or  cerebellar deficits apparent.    CXR PA and Lateral:   12/24/2017 :    I personally reviewed images and agree with radiology impression as follows:    wnl      Assessment:

## 2017-12-25 ENCOUNTER — Encounter: Payer: Self-pay | Admitting: Internal Medicine

## 2017-12-25 NOTE — Assessment & Plan Note (Addendum)
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 on 2 drugs x 3 years at [facility]> records requested 12/24/2017  - CT chest 10/16/17 > Cluster of nodules seen posteriorly in left lower lobe appears to be slightly increased in size and number compared to prior exam; this may simply represent atypical inflammation or scarring - cxr 12/24/2017 nl   MAI with mpns  is an extremely common and usually benign condition in the elderly and does not warrant aggressive eval/ rx at this point unless there is a clinical correlation suggesting unaddressed pulmonary infection (purulent sputum, night sweats, unintended wt loss, doe) or evolution of  obvious changes on plain cxr (as opposed to serial CT, which is way over sensitive to make clinical decisions re intervention and treatment in the elderly, who tend to tolerate both dx and treatment poorly) .   Since she already carries dx and apparently did not tol prev rx well, I would be very reluctant to consider aggressive dx (fob/bx) or rx at this point  Discussed in detail all the  indications, usual  risks and alternatives  relative to the benefits with patient/husband  who agreee to proceed with conservative f/u as outlined = call for symptoms    Total time devoted to counseling  > 50 % of initial 60 min office visit:  review case with pt/husband discussion of options/alternatives/ personally creating written customized instructions  in presence of pt  then going over those specific  Instructions directly with the pt including how to use all of the meds but in particular covering each new medication in detail and the difference between the maintenance= "automatic" meds and the prns using an action plan format for the latter (If this problem/symptom => do that organization reading Left to right).  Please see AVS from this visit for a full list of these instructions which I personally wrote for this pt and  are unique to this visit.

## 2017-12-25 NOTE — Progress Notes (Signed)
LMTCB

## 2017-12-30 NOTE — Progress Notes (Signed)
Spoke with pt and notified of results per Dr. Wert. Pt verbalized understanding and denied any questions. 

## 2018-02-09 ENCOUNTER — Other Ambulatory Visit: Payer: Self-pay | Admitting: Obstetrics and Gynecology

## 2018-02-09 ENCOUNTER — Other Ambulatory Visit: Payer: Self-pay | Admitting: Family Medicine

## 2018-02-09 DIAGNOSIS — Z1231 Encounter for screening mammogram for malignant neoplasm of breast: Secondary | ICD-10-CM

## 2018-03-20 IMAGING — MG 2D DIGITAL SCREENING BILATERAL MAMMOGRAM WITH CAD AND ADJUNCT TO
9 of 13 series · 9 of 29 positions shown · non-contrast
Comparison: Previous exam(s).

CLINICAL DATA: Screening.

EXAM:
2D DIGITAL SCREENING BILATERAL MAMMOGRAM WITH CAD AND ADJUNCT TOMO

[L MLO (1 of 2)]
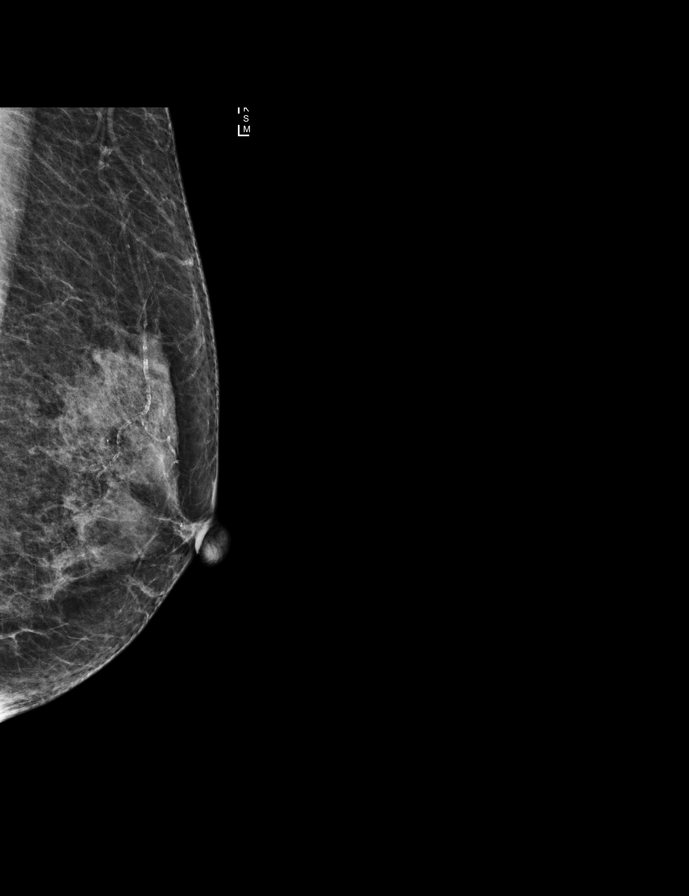

[L CC synth-2D]
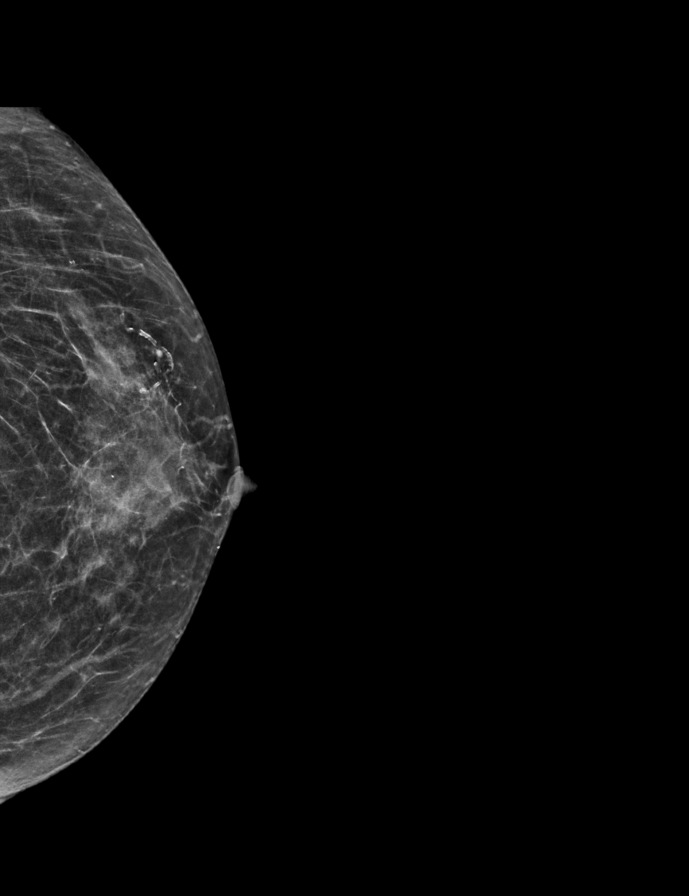

[L MLO synth-2D]
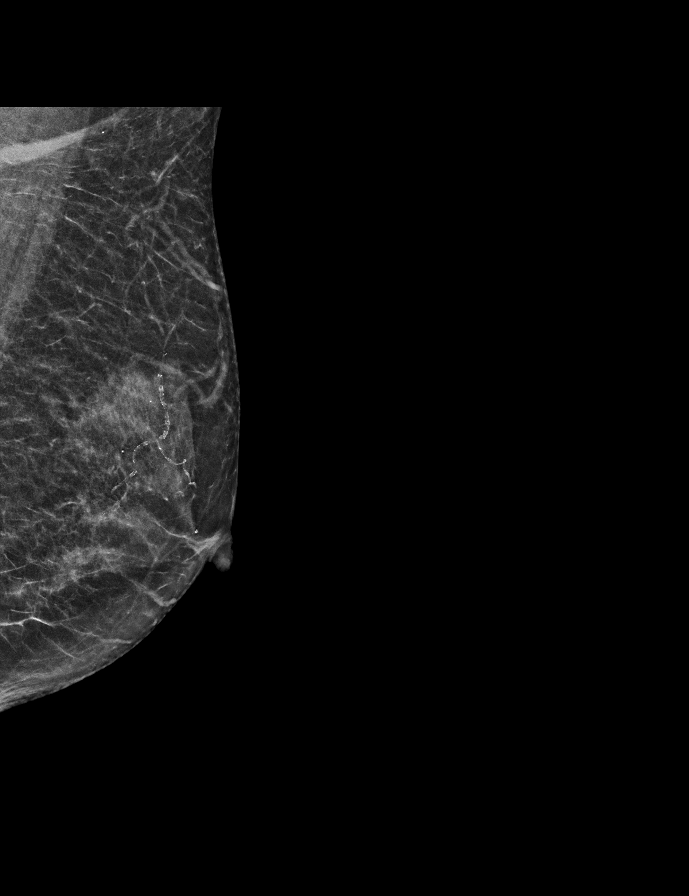

[L MLO (2 of 2)]
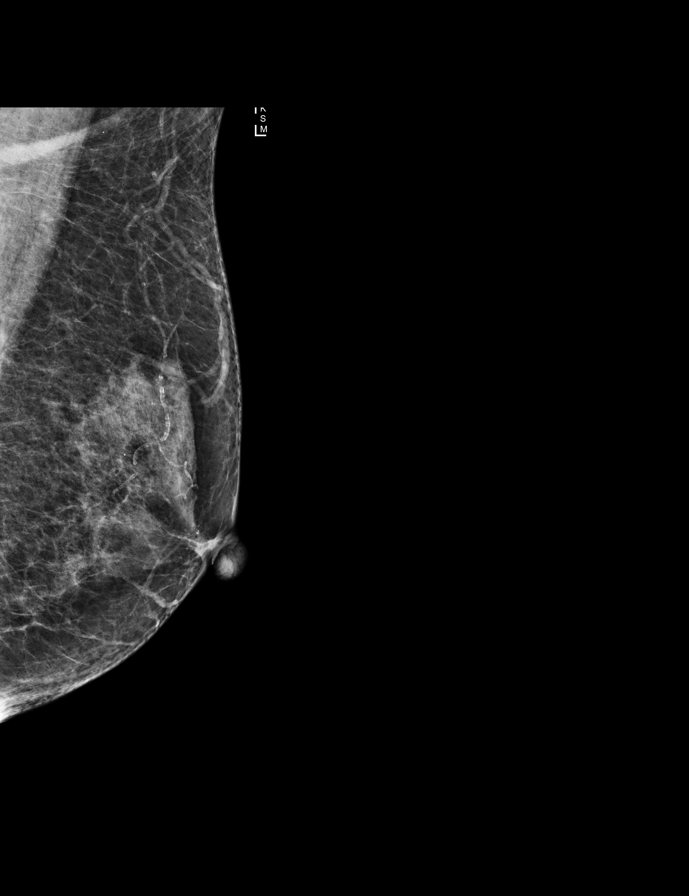

[R MLO]
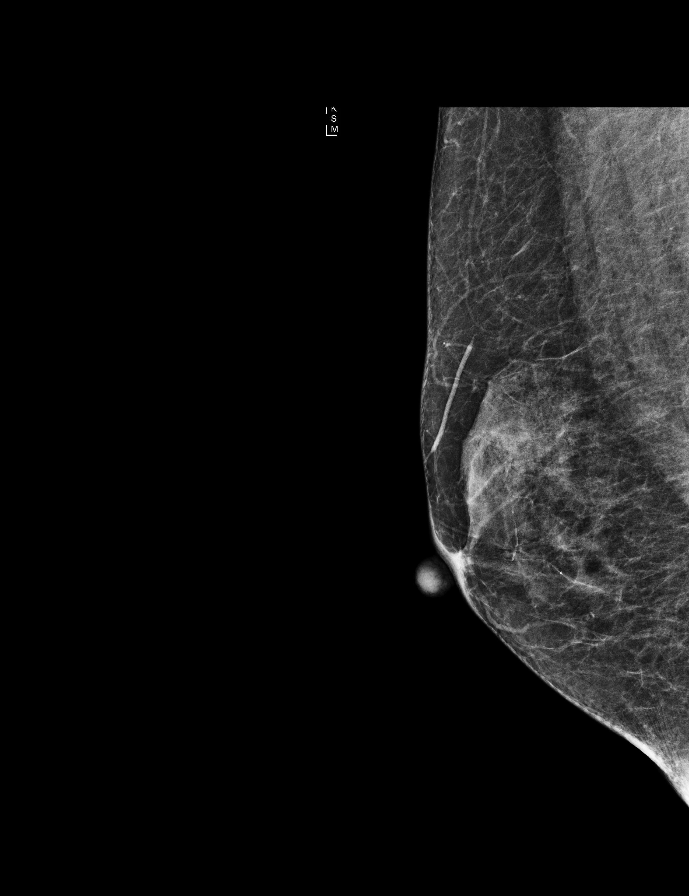

[R CC synth-2D]
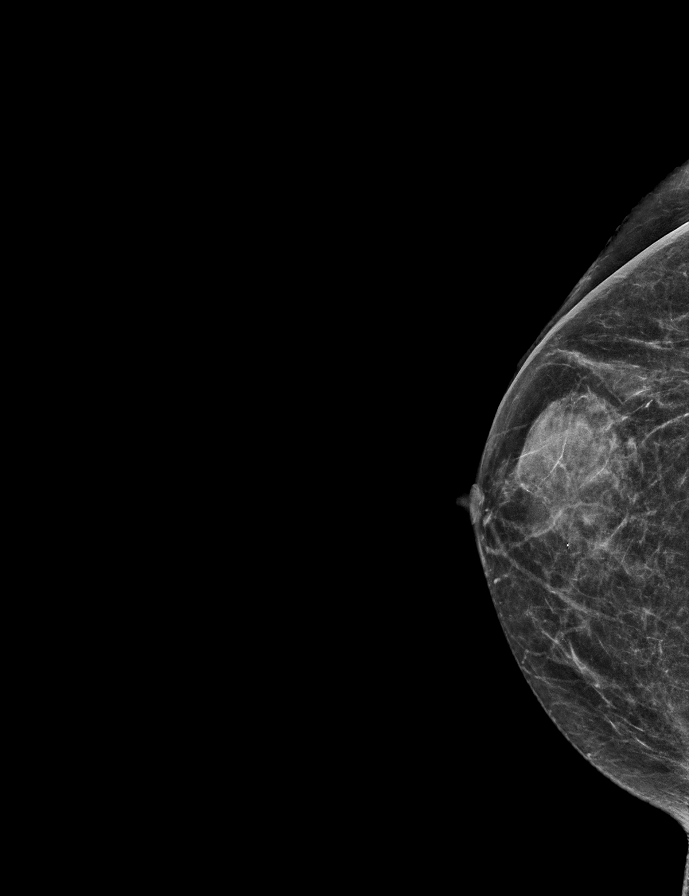

[R MLO synth-2D]
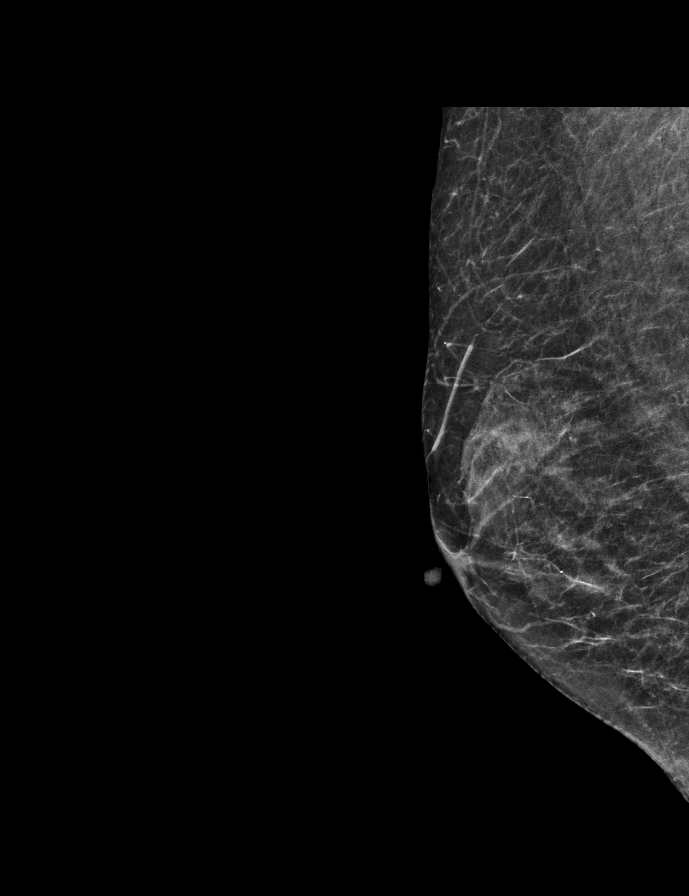

[R CC]
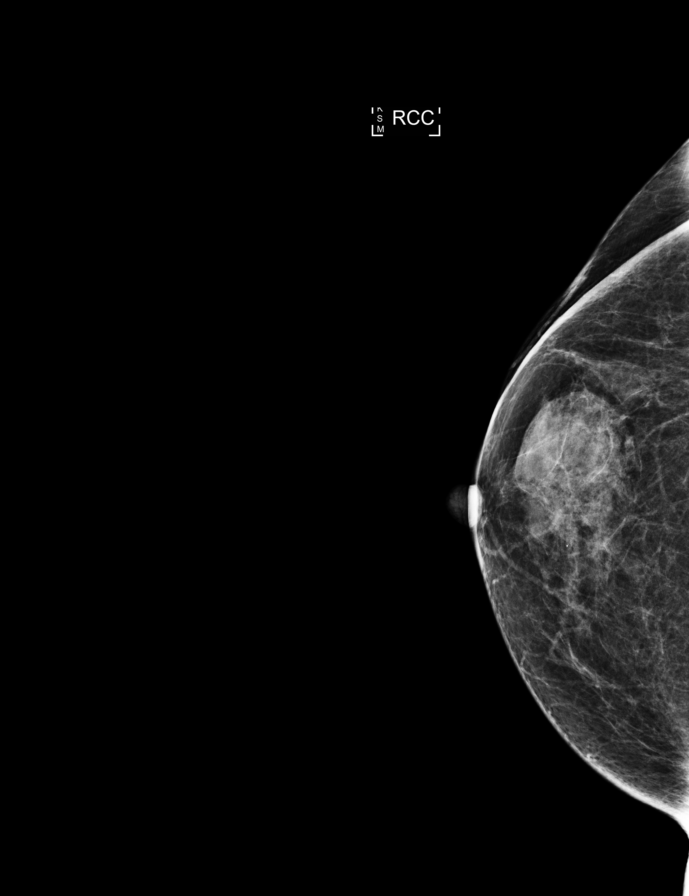

[L CC]
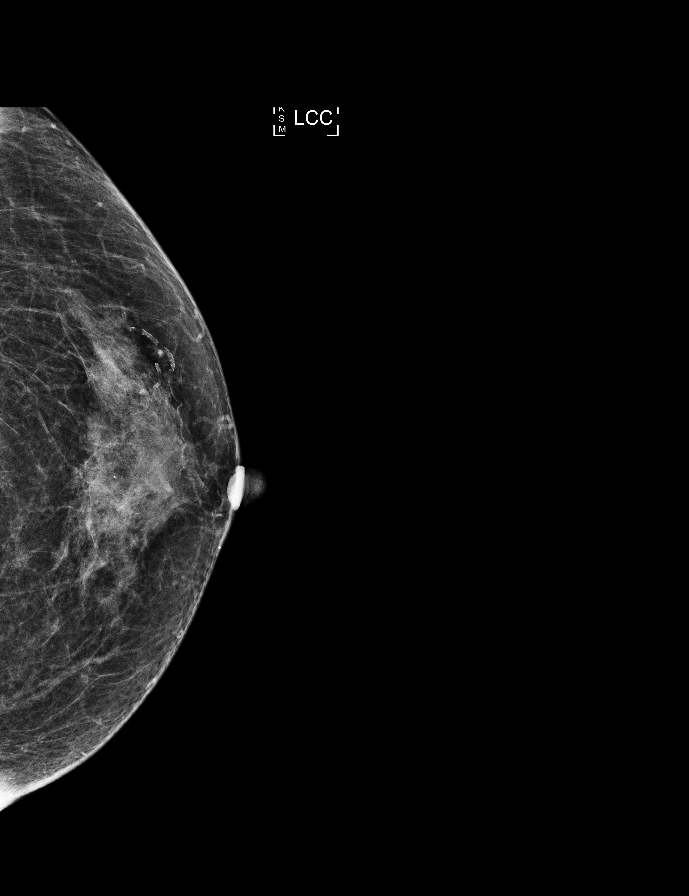

[9 of 29 positions shown; findings below may reference images not displayed]

ACR Breast Density Category c: The breast tissue is heterogeneously
dense, which may obscure small masses.
FINDINGS: There are no findings suspicious for malignancy. Images were
processed with CAD.
IMPRESSION: No mammographic evidence of malignancy. A result letter of this
screening mammogram will be mailed directly to the patient.

RECOMMENDATION:
Screening mammogram in one year. (Code:TN-[id number])

BI-RADS CATEGORY  1: Negative.

## 2018-03-23 ENCOUNTER — Ambulatory Visit
Admission: RE | Admit: 2018-03-23 | Discharge: 2018-03-23 | Disposition: A | Discharge status: Home / Self Care | Payer: Medicare Other | Source: Ambulatory Visit | Attending: Family Medicine | Admitting: Family Medicine

## 2018-03-23 DIAGNOSIS — Z1231 Encounter for screening mammogram for malignant neoplasm of breast: Secondary | ICD-10-CM | POA: Diagnosis not present

## 2018-07-23 DIAGNOSIS — J069 Acute upper respiratory infection, unspecified: Secondary | ICD-10-CM | POA: Diagnosis not present

## 2018-07-27 DIAGNOSIS — R05 Cough: Secondary | ICD-10-CM | POA: Diagnosis not present

## 2018-10-07 ENCOUNTER — Encounter: Payer: Self-pay | Admitting: Cardiovascular Disease

## 2018-10-09 NOTE — Progress Notes (Deleted)
Cardiology Office Note   Date:  10/09/2018   ID:  Allison Johnston, Allison Johnston [birth year], MRN [id number]  PCP:  [facility] Dr Zachery Dauer   Cardiologist:   Charlton Haws, MD   No chief complaint on file.     History of Present Illness:  78 y.o. familial history of aneurysmal disease Chronic lung disease Two sisters with AAA one got stented. Last CTA  10/16/17 stable ascending aortic diameter 4.0 cm Also noted some chronic lung changes MAI and thyroid nodule   2018 Echo with normal EF mild AR  [place] with no AAA and CTA with mild aortic Root enlargement 4.0 cm stable by most recent CTA 10/16/17 reviewed  Some chronic stasis  from varicose veins in legs. Has two children daughter in [place] And son MD in [place] with two grandchildren   No cardiac complaints   Past Medical History:  Diagnosis Date  . Aortic arch anomaly   . Family history of thoracic aortic aneurysm   . 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 Hx of colonic polyps   . Mixed stress and urge urinary incontinence   . Vaginal atrophy     Past Surgical History:  Procedure Laterality Date  . ABDOMINAL HYSTERECTOMY    . APPENDECTOMY    . EYE SURGERY       Current Outpatient Medications  Medication Sig Dispense Refill  . Calcium Carbonate-Vitamin D 600-200 MG-UNIT CAPS Take 1 tablet by mouth 2 (two) times daily.    . Cholecalciferol (VITAMIN D) 2000 units CAPS Take 2,000 Units by mouth daily.    . Estradiol 10 MCG TABS vaginal tablet Place vaginally 2 (two) times a week.    Marland Kitchen FIBER PO Take by mouth as directed.     . Flaxseed, Linseed, (FLAX SEED OIL PO) Take by mouth 2 (two) times daily.    . Multiple Vitamin (MULTIVITAMIN) tablet Take 1 tablet by mouth daily.    . Multiple Vitamins-Minerals (HAIR SKIN AND NAILS FORMULA PO) Take by mouth 2 (two) times daily.    . Omega-3 Fatty Acids (FISH OIL) 1000 MG CAPS Take 2,000 mcg by mouth 2 (two) times daily.    . Turmeric 500 MG CAPS Take 500  mg by mouth daily.     No current facility-administered medications for this visit.     Allergies:   Penicillins; Amoxicillin; Erythromycin; and Sulfa antibiotics    Social History:  The patient  reports that she has never smoked. She has never used smokeless tobacco. She reports that she does not drink alcohol or use drugs.   Family History:  The patient's family history is not on file.    ROS:  Please see the history of present illness.   Otherwise, review of systems are positive for none.   All other systems are reviewed and negative.    PHYSICAL EXAM: There were no vitals taken for this visit. Affect appropriate Thin elderly chinese female  HEENT: normal Neck supple with no adenopathy JVP normal no bruits no thyromegaly Lungs clear with no wheezing and good diaphragmatic motion Heart:  S1/S2 no murmur, no rub, gallop or click PMI normal Abdomen: benighn, BS positve, no tenderness, no AAA no bruit.  No HSM or HJR Distal pulses intact with no bruits No edema Neuro non-focal Skin warm and dry No muscular weakness    EKG:  SR rate 56 normal ECG    Recent Labs: No results  found for requested labs within last 8760 hours.    Lipid Panel No results found for: CHOL, TRIG, HDL, CHOLHDL, VLDL, LDLCALC, LDLDIRECT    Wt Readings from Last 3 Encounters:  12/24/17 56.9 kg  10/20/17 56.9 kg  05/28/17 56.5 kg      Other studies Reviewed: Additional studies/ records that were reviewed today include: Notes from Dr Zachery Dauer CXR .    ASSESSMENT AND PLAN:  1. MAI:  CT chronic changes f/u pulmonary  2. Family history Aneurysm :  CTA 10/11/17  Ao 4.0 cm no AAA by duplex stable 3. Edema:  Mild dependent low sodium diet elevated at end of day  4. Dyspnea:  Related to lung disease echo 10/24/16 EF normal  5. AR: mild not evident on exam observe   6. Thyroid: 12 mm right thyroid nodule f/u primary   Current medicines are reviewed at length with the patient today.  The patient  does not have concerns regarding medicines.  The following changes have been made:  no change  Labs/ tests ordered today include: *** No orders of the defined types were placed in this encounter.    Disposition:   FU with in a year      Signed, Charlton Haws, MD  10/09/2018 3:51 PM    [facility] [address] [place], [place], [place]  [address] Phone: [phone]; Fax: [phone]

## 2018-10-14 DIAGNOSIS — H5211 Myopia, right eye: Secondary | ICD-10-CM | POA: Diagnosis not present

## 2018-10-20 ENCOUNTER — Telehealth: Payer: Self-pay

## 2018-10-20 NOTE — Telephone Encounter (Signed)
Tried to call patient.  Need to cancel appointment if not needed at this time, due to COVID19 precautions. Will need to reschedule at a later date.

## 2018-10-22 ENCOUNTER — Ambulatory Visit: Payer: Medicare Other | Admitting: Cardiovascular Disease

## 2018-11-09 ENCOUNTER — Telehealth: Payer: Self-pay

## 2018-11-09 NOTE — Telephone Encounter (Signed)
Due to COVID19 precautions, patient decided to cancel her appointment on 11/16/18. Called patient about calling to set up for virtual visit. Patient felt like it was best for her to move her appointment out and see Dr. Eden Emms in person. Informed patient to give our office a call if she has any concerns or issues that come up. Patient verbalized understanding and agreed to plan. Patient has OV in August.

## 2018-11-16 ENCOUNTER — Ambulatory Visit: Payer: Medicare Other | Admitting: Cardiovascular Disease

## 2018-12-23 DIAGNOSIS — R300 Dysuria: Secondary | ICD-10-CM | POA: Diagnosis not present

## 2019-02-15 ENCOUNTER — Other Ambulatory Visit: Payer: Self-pay | Admitting: Family Medicine

## 2019-02-15 DIAGNOSIS — Z1231 Encounter for screening mammogram for malignant neoplasm of breast: Secondary | ICD-10-CM

## 2019-03-11 NOTE — Progress Notes (Deleted)
Virtual Visit via Video Note   This visit type was conducted due to national recommendations for restrictions regarding the COVID-19 Pandemic (e.g. social distancing) in an effort to limit this patient's exposure and mitigate transmission in our community.  Due to her co-morbid illnesses, this patient is at least at moderate risk for complications without adequate follow up.  This format is felt to be most appropriate for this patient at this time.  All issues noted in this document were discussed and addressed.  A limited physical exam was performed with this format.  Please refer to the patient's chart for her consent to telehealth for [facility].   Date:  03/11/2019   ID:  Allison JuniorMyung J Johnston, DOB 1941-06-06, MRN [id number]  Patient Location: Home Provider Location: Office  PCP:  Juluis RainierBarnes, Elizabeth, MD  Cardiologist:   Eden EmmsNishan Electrophysiologist:  None   Evaluation Performed:  Follow-Up Visit  Chief Complaint:  Dyspnea  History of Present Illness:    78 y.o. followed by pulmonary for MAI. Family history of Anuerysm. Previous echo with mild AR and normal EF History of mild dependant edema Last CTA done 10/16/17 ascending aorta stable at 4.0 cm Duplex abdomen 11/29/16 no AAA  Echo 10/24/16 EF 55-60% mild AR   ***  The patient  does not have symptoms concerning for COVID-19 infection (fever, chills, cough, or new shortness of breath).    Past Medical History:  Diagnosis Date  . Aortic arch anomaly   . Family history of thoracic aortic aneurysm   . 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 Hx of colonic polyps   . Mixed stress and urge urinary incontinence   . Vaginal atrophy    Past Surgical History:  Procedure Laterality Date  . ABDOMINAL HYSTERECTOMY    . APPENDECTOMY    . EYE SURGERY       No outpatient medications have been marked as taking for the 03/15/19 encounter (Appointment) with Wendall StadeNishan, Yvonnia Tango C, MD.     Allergies:    Penicillins, Amoxicillin, Erythromycin, and Sulfa antibiotics   Social History   Tobacco Use  . Smoking status: Never Smoker  . Smokeless tobacco: Never Used  Substance Use Topics  . Alcohol use: No  . Drug use: No     Family Hx: The patient's family history is not on file.  ROS:   Please see the history of present illness.     All other systems reviewed and are negative.   Prior CV studies:   The following studies were reviewed today:  Echo: 10/24/16 AAA Duplex 11/29/16 CTA chest 10/16/17    Labs/Other Tests and Data Reviewed:    EKG:  10/09/16 SR rate 56 normal   Recent Labs: No results found for requested labs within last 8760 hours.   Recent Lipid Panel No results found for: CHOL, TRIG, HDL, CHOLHDL, LDLCALC, LDLDIRECT  Wt Readings from Last 3 Encounters:  12/24/17 125 lb 6.4 oz (56.9 kg)  10/20/17 125 lb 8 oz (56.9 kg)  05/28/17 124 lb 8 oz (56.5 kg)     Objective:    Vital Signs:  There were no vitals taken for this visit.   Skin warm and dry No distress No tachypnea No JVP elevation  Neuro appears non focal No edema  Telephone visit no exam   ASSESSMENT & PLAN:    1. Dyspnea:  Non cardiac normal EF by echo normal ECG History of MAI Seen  by Dr Melvyn Novas 12/25/17 and CT with cluster of nodules LLL 10/16/17 Being Rx conservatively due to age and lack of systemic symptoms  2. AR:  Mild by echo consider f/u if new symptoms  3. Aortic Aneurysm:  Has outlived any malignant family history 4.0 cm ascending Ao 10/16/17 consider f/u in a year    COVID-19 Education: The signs and symptoms of COVID-19 were discussed with the patient and how to seek care for testing (follow up with PCP or arrange E-visit).  The importance of social distancing was discussed today.  Time:   Today, I have spent 30 minutes with the patient with telehealth technology discussing the above problems.     Medication Adjustments/Labs and Tests Ordered: Current medicines are reviewed at  length with the patient today.  Concerns regarding medicines are outlined above.   Tests Ordered:  None   Medication Changes:  None   Disposition:  Follow up in a year  Signed, Jenkins Rouge, MD  03/11/2019 11:04 AM    [place]

## 2019-03-12 DIAGNOSIS — B079 Viral wart, unspecified: Secondary | ICD-10-CM | POA: Diagnosis not present

## 2019-03-12 DIAGNOSIS — L600 Ingrowing nail: Secondary | ICD-10-CM | POA: Diagnosis not present

## 2019-03-12 DIAGNOSIS — M79672 Pain in left foot: Secondary | ICD-10-CM | POA: Diagnosis not present

## 2019-03-12 DIAGNOSIS — M79671 Pain in right foot: Secondary | ICD-10-CM | POA: Diagnosis not present

## 2019-03-15 ENCOUNTER — Ambulatory Visit: Payer: Medicare Other | Admitting: Cardiovascular Disease

## 2019-03-15 DIAGNOSIS — R3989 Other symptoms and signs involving the genitourinary system: Secondary | ICD-10-CM | POA: Diagnosis not present

## 2019-03-29 ENCOUNTER — Ambulatory Visit: Payer: Medicare Other

## 2019-04-19 DIAGNOSIS — A310 Pulmonary mycobacterial infection: Secondary | ICD-10-CM | POA: Diagnosis not present

## 2019-04-19 DIAGNOSIS — I712 Thoracic aortic aneurysm, without rupture: Secondary | ICD-10-CM | POA: Diagnosis not present

## 2019-04-19 DIAGNOSIS — Z0000 Encounter for general adult medical examination without abnormal findings: Secondary | ICD-10-CM | POA: Diagnosis not present

## 2019-04-19 DIAGNOSIS — M8588 Other specified disorders of bone density and structure, other site: Secondary | ICD-10-CM | POA: Diagnosis not present

## 2019-05-07 ENCOUNTER — Ambulatory Visit
Admission: RE | Admit: 2019-05-07 | Discharge: 2019-05-07 | Disposition: A | Discharge status: Home / Self Care | Payer: Medicare Other | Source: Ambulatory Visit | Attending: Family Medicine | Admitting: Family Medicine

## 2019-05-07 ENCOUNTER — Other Ambulatory Visit: Payer: Self-pay

## 2019-05-07 DIAGNOSIS — Z1231 Encounter for screening mammogram for malignant neoplasm of breast: Secondary | ICD-10-CM

## 2019-05-20 NOTE — Progress Notes (Signed)
Cardiology Office Note   Date:  05/21/2019   ID:  Allison Johnston, [place] 08-30-1940, MRN [id number]  PCP:  [facility] Dr Drema [place]   Cardiologist:   Jenkins Rouge, MD   No chief complaint on file.     History of Present Illness:  78 y.o. familial history of aneurysmal disease Chronic lung disease Two sisters with AAA one got stented. 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 Has two children daughter in [place] And son MD in [place] with two grandchildren   No cardiac complaints   Past Medical History:  Diagnosis Date  . Aortic arch anomaly   . Family history of thoracic aortic aneurysm   . 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 Hx of colonic polyps   . Mixed stress and urge urinary incontinence   . Vaginal atrophy     Past Surgical History:  Procedure Laterality Date  . ABDOMINAL HYSTERECTOMY    . APPENDECTOMY    . BREAST EXCISIONAL BIOPSY Left   . EYE SURGERY       Current Outpatient Medications  Medication Sig Dispense Refill  . Calcium Carbonate-Vitamin D 600-200 MG-UNIT CAPS Take 1 tablet by mouth 2 (two) times daily.    . Cholecalciferol (VITAMIN D) 2000 units CAPS Take 2,000 Units by mouth daily.    . Estradiol 10 MCG TABS vaginal tablet Place vaginally 2 (two) times a week.    Marland Kitchen FIBER PO Take by mouth as directed.     . Flaxseed, Linseed, (FLAX SEED OIL PO) Take by mouth 2 (two) times daily.    . Multiple Vitamin (MULTIVITAMIN) tablet Take 1 tablet by mouth daily.    . Multiple Vitamins-Minerals (HAIR SKIN AND NAILS FORMULA PO) Take by mouth 2 (two) times daily.    . Omega-3 Fatty Acids (FISH OIL) 1000 MG CAPS Take 2,000 mcg by mouth 2 (two) times daily.    . Turmeric  500 MG CAPS Take 500 mg by mouth daily.     No current facility-administered medications for this visit.     Allergies:   Penicillins, Amoxicillin, Erythromycin, and Sulfa antibiotics    Social History:  The patient  reports that she has never smoked. She has never used smokeless tobacco. She reports that she does not drink alcohol or use drugs.   Family History:  The patient's family history is not on file.    ROS:  Please see the history of present illness.   Otherwise, review of systems are positive for none.   All other systems are reviewed and negative.    PHYSICAL EXAM: BP (!) 110/54   Pulse (!) 56   Ht 5\' 2"  (1.575 m)   Wt 120 lb (54.4 kg)   SpO2 96%   BMI 21.95 kg/m  Affect appropriate Thin elderly chinese female  HEENT: normal Neck supple with no adenopathy JVP normal no bruits no thyromegaly Lungs clear with no wheezing and good diaphragmatic motion Heart:  S1/S2 no murmur, no rub, gallop or click PMI normal Abdomen: benighn, BS positve, no tenderness, no AAA no bruit.  No HSM or HJR Distal pulses intact with no bruits No  edema Neuro non-focal Skin warm and dry No muscular weakness    EKG:  SR rate 56 normal ECG 05/21/19 SB rate 56 normal    Recent Labs: No results found for requested labs within last 8760 hours.    Lipid Panel No results found for: CHOL, TRIG, HDL, CHOLHDL, VLDL, LDLCALC, LDLDIRECT    Wt Readings from Last 3 Encounters:  05/21/19 120 lb (54.4 kg)  12/24/17 125 lb 6.4 oz (56.9 kg)  10/20/17 125 lb 8 oz (56.9 kg)      Other studies Reviewed: Additional studies/ records that were reviewed today include: Notes from Dr Zachery Dauer CXR .    ASSESSMENT AND PLAN:  1. MAI:  CT chronic changes f/u pulmonary  2. Family history Aneurysm :  CTA 10/11/17  Ao 4.0 cm no AAA by duplex stable 3. Edema:  Mild dependent low sodium diet elevated at end of day  4. Dyspnea:  Related to lung disease echo 10/24/16 EF normal repeat 5. AR: mild f/u  echo to evaluate  6. Thyroid: 12 mm right thyroid nodule f/u primary   Current medicines are reviewed at length with the patient today.  The patient does not have concerns regarding medicines.  The following changes have been made:  no change  Labs/ tests ordered today include: CTA Chest, March 2021 echo for AR   Orders Placed This Encounter  Procedures  . CT ANGIO CHEST AORTA W &/OR WO CONTRAST  . EKG 12-Lead  . ECHOCARDIOGRAM COMPLETE     Disposition:   FU with in a year     Signed, Charlton Haws, MD  05/21/2019 11:18 AM    [facility] [address] [place], [place], [place]  [address] Phone: [phone]; Fax: [phone]

## 2019-05-21 ENCOUNTER — Encounter: Payer: Self-pay | Admitting: Cardiovascular Disease

## 2019-05-21 ENCOUNTER — Ambulatory Visit (INDEPENDENT_AMBULATORY_CARE_PROVIDER_SITE_OTHER): Payer: Medicare Other | Admitting: Cardiovascular Disease

## 2019-05-21 ENCOUNTER — Other Ambulatory Visit: Payer: Self-pay

## 2019-05-21 VITALS — BP 110/54 | HR 56 | Ht 62.0 in | Wt 120.0 lb

## 2019-05-21 DIAGNOSIS — I712 Thoracic aortic aneurysm, without rupture, unspecified: Secondary | ICD-10-CM

## 2019-05-21 DIAGNOSIS — R0600 Dyspnea, unspecified: Secondary | ICD-10-CM

## 2019-05-21 DIAGNOSIS — I351 Nonrheumatic aortic (valve) insufficiency: Secondary | ICD-10-CM

## 2019-05-21 NOTE — Patient Instructions (Signed)
Medication Instructions:   *If you need a refill on your cardiac medications before your next appointment, please call your pharmacy*  Lab Work:  If you have labs (blood work) drawn today and your tests are completely normal, you will receive your results only by: Marland Kitchen MyChart Message (if you have MyChart) OR . A paper copy in the mail If you have any lab test that is abnormal or we need to change your treatment, we will call you to review the results.  Testing/Procedures: Your physician has requested that you have an echocardiogram. Echocardiography is a painless test that uses sound waves to create images of your heart. It provides your doctor with information about the size and shape of your heart and how well your heart's chambers and valves are working. This procedure takes approximately one hour. There are no restrictions for this procedure.  Your physician has requested that you have cardiac CT in March. Cardiac computed tomography (CT) is a painless test that uses an x-ray machine to take clear, detailed pictures of your heart. For further information please visit [facility]uge[facility]iesta[facility]tn. Please follow instruction sheet as given.  Follow-Up: At [facility], you and your health needs are our priority.  As part of our continuing mission to provide you with exceptional heart care, we have created designated Provider Care Teams.  These Care Teams include your primary Cardiologist (physician) and Advanced Practice Providers (APPs -  Physician Assistants and Nurse Practitioners) who all work together to provide you with the care you need, when you need it.  Your next appointment:   6 months- You will receive a reminder letter in the mail two months in advance. If you don't receive a letter, please call our office to schedule the follow-up appointment.  The format for your next appointment:   In Person  Provider:   You may see Dr. Johnsie Cancel or one of the following Advanced Practice Providers on  your designated Care Team:    Truitt Merle, NP  Cecilie Kicks, NP  Kathyrn Drown, NP

## 2019-06-02 ENCOUNTER — Ambulatory Visit (HOSPITAL_COMMUNITY): Payer: Medicare Other | Attending: Cardiovascular Disease

## 2019-06-02 ENCOUNTER — Other Ambulatory Visit: Payer: Self-pay

## 2019-06-02 DIAGNOSIS — I351 Nonrheumatic aortic (valve) insufficiency: Secondary | ICD-10-CM | POA: Diagnosis not present

## 2019-09-28 ENCOUNTER — Other Ambulatory Visit: Payer: Self-pay

## 2019-09-28 DIAGNOSIS — Z01812 Encounter for preprocedural laboratory examination: Secondary | ICD-10-CM

## 2019-09-28 DIAGNOSIS — I719 Aortic aneurysm of unspecified site, without rupture: Secondary | ICD-10-CM

## 2019-09-28 NOTE — Progress Notes (Signed)
Patient needs BMET for up coming CT.

## 2019-09-29 ENCOUNTER — Other Ambulatory Visit: Payer: Medicare Other | Admitting: *Deleted

## 2019-09-29 ENCOUNTER — Other Ambulatory Visit: Payer: Self-pay

## 2019-09-29 DIAGNOSIS — I719 Aortic aneurysm of unspecified site, without rupture: Secondary | ICD-10-CM

## 2019-09-29 DIAGNOSIS — Z01812 Encounter for preprocedural laboratory examination: Secondary | ICD-10-CM | POA: Diagnosis not present

## 2019-09-30 LAB — BASIC METABOLIC PANEL
BUN/Creatinine Ratio: 29 — ABNORMAL HIGH (ref 12–28)
BUN: 21 mg/dL (ref 8–27)
CO2: 28 mmol/L (ref 20–29)
Calcium: 9.1 mg/dL (ref 8.7–10.3)
Chloride: 103 mmol/L (ref 96–106)
Creatinine, Ser: 0.72 mg/dL (ref 0.57–1.00)
GFR calc Af Amer: 91 mL/min/{1.73_m2} (ref >59)
GFR calc non Af Amer: 79 mL/min/{1.73_m2} (ref >59)
Glucose: 95 mg/dL (ref 65–99)
Potassium: 4.2 mmol/L (ref 3.5–5.2)
Sodium: 143 mmol/L (ref 134–144)

## 2019-10-05 ENCOUNTER — Other Ambulatory Visit: Payer: Self-pay

## 2019-10-05 ENCOUNTER — Ambulatory Visit (INDEPENDENT_AMBULATORY_CARE_PROVIDER_SITE_OTHER)
Admission: RE | Admit: 2019-10-05 | Discharge: 2019-10-05 | Disposition: A | Discharge status: Home / Self Care | Payer: Medicare Other | Source: Ambulatory Visit | Attending: Cardiovascular Disease | Admitting: Cardiovascular Disease

## 2019-10-05 DIAGNOSIS — I712 Thoracic aortic aneurysm, without rupture, unspecified: Secondary | ICD-10-CM

## 2019-10-05 MED ORDER — IOHEXOL 300 MG/ML  SOLN
100.0000 mL | Freq: Once | INTRAMUSCULAR | Status: AC | PRN
  Administered 2019-10-05: 100 mL via INTRAVENOUS

## 2019-10-06 ENCOUNTER — Telehealth: Payer: Self-pay | Admitting: Cardiovascular Disease

## 2019-10-06 DIAGNOSIS — A310 Pulmonary mycobacterial infection: Secondary | ICD-10-CM

## 2019-10-06 NOTE — Telephone Encounter (Signed)
Follow Up:   Returning your call from today, concerning her CT results.

## 2019-10-06 NOTE — Telephone Encounter (Signed)
Patient aware of results. Will put in referral to pulmonology.

## 2019-10-06 NOTE — Telephone Encounter (Signed)
-----   Message from Wendall Stade, MD sent at 10/06/2019  9:38 AM EST ----- Aorta stable 4 cm she has MAI and lung disease If she has not already seen a lung doctor have her see [place] [facility]

## 2019-10-14 DIAGNOSIS — H5211 Myopia, right eye: Secondary | ICD-10-CM | POA: Diagnosis not present

## 2019-10-22 ENCOUNTER — Institutional Professional Consult (permissible substitution): Payer: Medicare Other | Admitting: Internal Medicine

## 2019-10-29 ENCOUNTER — Ambulatory Visit: Payer: Medicare Other | Admitting: Internal Medicine

## 2019-10-29 ENCOUNTER — Encounter: Payer: Self-pay | Admitting: Internal Medicine

## 2019-10-29 ENCOUNTER — Other Ambulatory Visit: Payer: Self-pay

## 2019-10-29 VITALS — BP 98/62 | HR 65 | Temp 98.2°F | Ht 62.0 in | Wt 122.4 lb

## 2019-10-29 DIAGNOSIS — A310 Pulmonary mycobacterial infection: Secondary | ICD-10-CM | POA: Diagnosis not present

## 2019-10-29 NOTE — Progress Notes (Signed)
Allison Johnston    [id number]    12/25/1940  Primary Care Physician:Barnes, Benjamine Mola, MD  Referring Physician: Josue Hector, MD [phone] [address] [place],  [place] [address] Reason for Consultation: abnormal CT Chest - MAI infection Date of Consultation: 10/29/2019  Chief complaint:   Chief Complaint  Patient presents with  . Consult     HPI: Allison Johnston is a 79 y.o. woman who is a well-preserved 79 yo. She presents for new patient evaluation for abnormal CT Chest and history of MAI infection. She has an aortic anuerysm under surveillence and gets serial CT scans. 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 She had some adverse effects with rashes from her therapy. She thinks she cleared her infection at that time.   Denies dyspnea, cough, weight loss, fevers, hemoptysis.  She does exercise daily - swimming, walking, aerobics machine.  No problems with ADLs.   Had pneumonia once 7-8 years ago. Another year later she had pneumonia again. Not hospitalized. She was treated with antibotics at home (levofloxacin.) Saw. Dr. Melvyn Novas in 2019 and at that point further work up was deferred due to her absence of symptoms.   Social history:  Occupation: homemaker Exposures: no pets, lives with husband has two sons, one is a Tax adviser in [place]. 2 grandchildren Smoking history: never smoker, no passive smoke exposure  Social History   Occupational History  . Not on file  Tobacco Use  . Smoking status: Never Smoker  . Smokeless tobacco: Never Used  Substance and Sexual Activity  . Alcohol use: No  . Drug use: No  . Sexual activity: Never    Relevant family history:  History reviewed. No pertinent family history.  Past Medical History:  Diagnosis Date  . Aortic arch anomaly   . Family history of thoracic aortic aneurysm   . Hardening of the aorta (main artery of the heart) (HCC)    IN THE  THORACIC AORTA  . Hx of colonic polyps   . Mixed stress and urge urinary incontinence   . Vaginal atrophy     Past Surgical History:  Procedure Laterality Date  . ABDOMINAL HYSTERECTOMY    . APPENDECTOMY    . BREAST EXCISIONAL BIOPSY Left   . EYE SURGERY      Review of systems: Review of Systems  Constitutional: Negative for chills, fever and weight loss.  HENT: Negative for congestion, sinus pain and sore throat.   Eyes: Negative for discharge and redness.  Respiratory: Negative for cough, hemoptysis, sputum production, shortness of breath and wheezing.   Cardiovascular: Negative for chest pain, palpitations and leg swelling.  Gastrointestinal: Negative for heartburn, nausea and vomiting.  Musculoskeletal: Negative for joint pain and myalgias.  Skin: Negative for rash.  Neurological: Negative for dizziness, tremors, focal weakness and headaches.  Endo/Heme/Allergies: Negative for environmental allergies.  Psychiatric/Behavioral: Negative for depression. The patient is not nervous/anxious.   All other systems reviewed and are negative.   Physical Exam: Blood pressure 98/62, pulse 65, temperature 98.2 F (36.8 C), temperature source Temporal, height _0  (1.575 m), weight 122 lb 6.4 oz (55.5 kg), SpO2 95 %. Gen:      No acute distress Lungs:    Mild kyphosis, No increased respiratory effort, symmetric chest wall excursion, clear to auscultation bilaterally, no wheezes or crackles CV:         Regular rate  and rhythm; no murmurs, rubs, or gallops.  No pedal edema Abd:      + bowel sounds; soft, non-tender; no distension MSK: no acute synovitis of DIP or PIP joints, no mechanics hands.  Skin:      Warm and dry; no rashes Neuro: normal speech, no focal facial asymmetry Psych: alert and oriented x3, normal mood and affect   Data Reviewed/Medical Decision Making:  Independent interpretation of tests: Imaging: . Review of patient's CT Chest 2020 images revealed bilateral RML  and lingula predominant tree in bud and ground glass opacities with some air bronchograms, can be consistent with MAI. Also a few pulmonary nodules. The patient's images have been independently reviewed by me.    PFTs: None on file  Labs:  Lab Results  Component Value Date   WBC 4.6 11/25/2012   HGB 14.2 11/25/2012   HCT 41.3 11/25/2012   MCV 94.7 11/25/2012   PLT 132 (L) 11/25/2012   Lab Results  Component Value Date   NA 143 09/29/2019   K 4.2 09/29/2019   CL 103 09/29/2019   CO2 28 09/29/2019     Immunization status:  Immunization History  Administered Date(s) Administered  . DTaP 10/14/2002  . Influenza Split 05/22/2012  . Influenza, High Dose Seasonal PF 04/06/2019  . Influenza, Seasonal, Injecte, Preservative Fre 04/06/2013  . Pneumococcal Polysaccharide-23 10/20/2012  . Pneumococcal-Unspecified 10/14/2002  . Zoster 10/16/2010  . Zoster Recombinat (Shingrix) 08/11/2017, 10/27/2017    . I reviewed prior external note(s) from Dr. Melvyn Novas, Dr. Johnsie Cancel . I reviewed the result(s) of the labs and imaging as noted above.   Assessment:  MAI infection  Plan/Recommendations:  Allison Johnston has persistence of CT Chest findings concerning for MAI infection. Her changes are not dramatically different when compared to her CT Chest angio in 2019.  We discussed disease management and progression at length today. She is not currently symptomatic from a respiratory or systemic infection standpoint.  She is not bringing up any sputum. We discussed that sometimes MAI  Infection can recur and cause problems during which time sputum culture collection as well as bronchoscopy could be warranted.  At this point reasonable to follow her with conservative management including annual visit. If she develops problems with recurrent infection or systemic signs of MAI infection (dyspnea, weight loss, fatigue etc.) I would want to see her back sooner. She and her husband are in agreement with this  plan.   Return to Care: Return in about 1 year (around 10/28/2020).  Lenice Llamas, MD Pul[facility] [place]  CC: Josue Hector, MD

## 2019-10-29 NOTE — Patient Instructions (Signed)
The patient should have follow up scheduled in 1 year.   Please come see me sooner than one year if you have any problems with pneumonia or breathing.

## 2019-11-20 ENCOUNTER — Emergency Department: Admit: 2019-11-21 | Payer: MEDICARE | Primary: Internal Medicine

## 2019-11-20 DIAGNOSIS — Y9289 Other specified places as the place of occurrence of the external cause: Secondary | ICD-10-CM | POA: Diagnosis not present

## 2019-11-20 DIAGNOSIS — W19XXXA Unspecified fall, initial encounter: Secondary | ICD-10-CM | POA: Diagnosis not present

## 2019-11-20 DIAGNOSIS — S300XXA Contusion of lower back and pelvis, initial encounter: Secondary | ICD-10-CM | POA: Diagnosis not present

## 2019-11-20 DIAGNOSIS — S0003XA Contusion of scalp, initial encounter: Secondary | ICD-10-CM | POA: Diagnosis not present

## 2019-11-20 DIAGNOSIS — R519 Headache, unspecified: Secondary | ICD-10-CM | POA: Diagnosis not present

## 2019-11-20 DIAGNOSIS — S0990XA Unspecified injury of head, initial encounter: Secondary | ICD-10-CM | POA: Diagnosis not present

## 2019-11-20 DIAGNOSIS — Z043 Encounter for examination and observation following other accident: Secondary | ICD-10-CM | POA: Diagnosis not present

## 2019-11-20 NOTE — ED Triage Notes (Signed)
Patient arrives with c/o head injury after falling and tripping over her dog. Patient hit back of the head. Denies LOC. No abrasion or laceration to the scalp. Denies taking blood thinners. Incident happened around 8pm tonight.

## 2019-11-20 NOTE — ED Notes (Signed)
Patient given discharge papers and instructions by primary RN. Patient verbalized understanding and stated not having any questions or concerns regarding her care. Patient ambulatory out of ED with son.

## 2019-11-20 NOTE — ED Provider Notes (Signed)
79 year old female with no significant past medical history presents with chief complaint of head pain and buttocks pain after a fall.  A dog jumped on her today and she fell backwards hitting her head.  She also landed on her buttocks.  She denies any neck, back pain, chest or abdominal pain.  She denies any loss of consciousness and is not on any blood thinners.  They did place ice on her head and she did take some Tylenol.           No past medical history on file.    No past surgical history on file.      No family history on file.    Social History     Socioeconomic History   ??? Marital status: MARRIED     Spouse name: Not on file   ??? Number of children: Not on file   ??? Years of education: Not on file   ??? Highest education level: Not on file   Occupational History   ??? Not on file   Social Needs   ??? Financial resource strain: Not on file   ??? Food insecurity     Worry: Not on file     Inability: Not on file   ??? Transportation needs     Medical: Not on file     Non-medical: Not on file   Tobacco Use   ??? Smoking status: Not on file   Substance and Sexual Activity   ??? Alcohol use: Not on file   ??? Drug use: Not on file   ??? Sexual activity: Not on file   Lifestyle   ??? Physical activity     Days per week: Not on file     Minutes per session: Not on file   ??? Stress: Not on file   Relationships   ??? Social Wellsite geologist on phone: Not on file     Gets together: Not on file     Attends religious service: Not on file     Active member of club or organization: Not on file     Attends meetings of clubs or organizations: Not on file     Relationship status: Not on file   ??? Intimate partner violence     Fear of current or ex partner: Not on file     Emotionally abused: Not on file     Physically abused: Not on file     Forced sexual activity: Not on file   Other Topics Concern   ??? Not on file   Social History Narrative   ??? Not on file         ALLERGIES: Patient has no allergy information on record.    Review of Systems    Constitutional: Negative for fever.   HENT: Negative for rhinorrhea.    Respiratory: Negative for shortness of breath.    Cardiovascular: Negative for chest pain.   Gastrointestinal: Negative for abdominal pain.   Genitourinary: Negative for dysuria.   Musculoskeletal: Negative for back pain.   Skin: Negative for wound.   Neurological: Positive for headaches.   Psychiatric/Behavioral: Negative for confusion.       Vitals:    11/20/19 2136   BP: (!) 146/74   Pulse: 65   Resp: 14   Temp: 97.8 ??F (36.6 ??C)   SpO2: 98%   Weight: 55.7 kg (122 lb 12.7 oz)   Height: 5\' 2"  (1.575 m)  Physical Exam  Vitals signs and nursing note reviewed.   Constitutional:       General: She is not in acute distress.     Appearance: Normal appearance. She is not ill-appearing, toxic-appearing or diaphoretic.   HENT:      Head: Normocephalic.      Comments: Small hematoma to the posterior superior scalp.  Eyes:      Extraocular Movements: Extraocular movements intact.   Neck:      Musculoskeletal: Normal range of motion.   Cardiovascular:      Rate and Rhythm: Normal rate and regular rhythm.      Pulses: Normal pulses.      Heart sounds: Normal heart sounds.   Pulmonary:      Effort: Pulmonary effort is normal. No respiratory distress.      Breath sounds: Normal breath sounds. No wheezing.   Abdominal:      General: Abdomen is flat. Bowel sounds are normal. There is no distension.      Palpations: Abdomen is soft.      Tenderness: There is no abdominal tenderness. There is no guarding.   Musculoskeletal: Normal range of motion.      Comments: Tenderness along the sacrum.  Patient is able to internally and externally rotate the hips without pain.   Skin:     General: Skin is warm and dry.   Neurological:      Mental Status: She is alert and oriented to person, place, and time.   Psychiatric:         Mood and Affect: Mood normal.          MDM  Number of Diagnoses or Management Options  Contusion of scalp, initial encounter  Sacral  contusion, initial encounter  Diagnosis management comments: 79-year-old female presents after a fall.  She has neurologically intact but a CT of the head, C-spine and plain film x-rays of the pelvis and sacrum will be obtained to rule out traumatic injury.  Imaging is unremarkable.  Discussed my clinical impression(s), any labs and/or radiology results with the patient. I answered any questions and addressed any concerns. Discussed the importance of following up with their primary care physician and/or specialist(s). Discussed signs or symptoms that would warrant return back to the ER for further evaluation. The patient is agreeable with discharge.       Amount and/or Complexity of Data Reviewed  Tests in the radiology section of CPT??: reviewed and ordered           Procedures

## 2019-11-20 NOTE — ED Provider Notes (Signed)
79 year old female with no significant past medical history presents with chief complaint of head pain and buttocks pain after a fall.  A dog jumped on her today and she fell backwards hitting her head.  She also landed on her buttocks.  She denies any neck, back pain, chest or abdominal pain.  She denies any loss of consciousness and is not on any blood thinners.  They did place ice on her head and she did take some Tylenol.           No past medical history on file.    No past surgical history on file.      No family history on file.    Social History     Socioeconomic History   ??? Marital status: MARRIED     Spouse name: Not on file   ??? Number of children: Not on file   ??? Years of education: Not on file   ??? Highest education level: Not on file   Occupational History   ??? Not on file   Social Needs   ??? Financial resource strain: Not on file   ??? Food insecurity     Worry: Not on file     Inability: Not on file   ??? Transportation needs     Medical: Not on file     Non-medical: Not on file   Tobacco Use   ??? Smoking status: Not on file   Substance and Sexual Activity   ??? Alcohol use: Not on file   ??? Drug use: Not on file   ??? Sexual activity: Not on file   Lifestyle   ??? Physical activity     Days per week: Not on file     Minutes per session: Not on file   ??? Stress: Not on file   Relationships   ??? Social Wellsite geologist on phone: Not on file     Gets together: Not on file     Attends religious service: Not on file     Active member of club or organization: Not on file     Attends meetings of clubs or organizations: Not on file     Relationship status: Not on file   ??? Intimate partner violence     Fear of current or ex partner: Not on file     Emotionally abused: Not on file     Physically abused: Not on file     Forced sexual activity: Not on file   Other Topics Concern   ??? Not on file   Social History Narrative   ??? Not on file         ALLERGIES: Patient has no allergy information on record.    Review of Systems    Constitutional: Negative for fever.   HENT: Negative for rhinorrhea.    Respiratory: Negative for shortness of breath.    Cardiovascular: Negative for chest pain.   Gastrointestinal: Negative for abdominal pain.   Genitourinary: Negative for dysuria.   Musculoskeletal: Negative for back pain.   Skin: Negative for wound.   Neurological: Positive for headaches.   Psychiatric/Behavioral: Negative for confusion.       Vitals:    11/20/19 2136   BP: (!) 146/74   Pulse: 65   Resp: 14   Temp: 97.8 ??F (36.6 ??C)   SpO2: 98%   Weight: 55.7 kg (122 lb 12.7 oz)   Height: 5\' 2"  (1.575 m)  Physical Exam  Vitals signs and nursing note reviewed.   Constitutional:       General: She is not in acute distress.     Appearance: Normal appearance. She is not ill-appearing, toxic-appearing or diaphoretic.   HENT:      Head: Normocephalic.      Comments: Small hematoma to the posterior superior scalp.  Eyes:      Extraocular Movements: Extraocular movements intact.   Neck:      Musculoskeletal: Normal range of motion.   Cardiovascular:      Rate and Rhythm: Normal rate and regular rhythm.      Pulses: Normal pulses.      Heart sounds: Normal heart sounds.   Pulmonary:      Effort: Pulmonary effort is normal. No respiratory distress.      Breath sounds: Normal breath sounds. No wheezing.   Abdominal:      General: Abdomen is flat. Bowel sounds are normal. There is no distension.      Palpations: Abdomen is soft.      Tenderness: There is no abdominal tenderness. There is no guarding.   Musculoskeletal: Normal range of motion.      Comments: Tenderness along the sacrum.  Patient is able to internally and externally rotate the hips without pain.   Skin:     General: Skin is warm and dry.   Neurological:      Mental Status: She is alert and oriented to person, place, and time.   Psychiatric:         Mood and Affect: Mood normal.          MDM  Number of Diagnoses or Management Options  Contusion of scalp, initial encounter  Sacral  contusion, initial encounter  Diagnosis management comments: 79 year old female presents after a fall.  She has neurologically intact but a CT of the head, C-spine and plain film x-rays of the pelvis and sacrum will be obtained to rule out traumatic injury.  Imaging is unremarkable.  Discussed my clinical impression(s), any labs and/or radiology results with the patient. I answered any questions and addressed any concerns. Discussed the importance of following up with their primary care physician and/or specialist(s). Discussed signs or symptoms that would warrant return back to the ER for further evaluation. The patient is agreeable with discharge.       Amount and/or Complexity of Data Reviewed  Tests in the radiology section of CPT??: reviewed and ordered           Procedures

## 2019-11-20 NOTE — ED Notes (Signed)
Patient arrives with c/o head injury after falling and tripping over her dog. Patient hit back of the head. Denies LOC. No abrasion or laceration to the scalp. Denies taking blood thinners. Incident happened around 8pm tonight.

## 2019-11-21 ENCOUNTER — Inpatient Hospital Stay
Admit: 2019-11-21 | Discharge: 2019-11-21 | Disposition: A | Discharge status: Home / Self Care | Payer: MEDICARE | Attending: Emergency Medicine

## 2019-11-22 NOTE — Progress Notes (Signed)
Cardiology Office Note   Date:  11/29/2019   ID:  Yitty, Roads [birth year], MRN [id number]  PCP:  [facility] Dr Zachery Dauer   Cardiologist:   Charlton Haws, MD   No chief complaint on file.     History of Present Illness:  79 y.o. familial history of aneurysmal disease Chronic lung disease Two sisters with AAA one got stented. Last CTA 10/05/19 stable ascending aortic diameter 4.0 cm Also noted some chronic lung changes MAI and thyroid nodule   TTE 06/02/19 with moderate AR tri leaflet valve EF 65-70% with no LVE   Some bruising from varicose veins in legs. 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 Aortic arch anomaly   . Family history of thoracic aortic aneurysm   . 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 Hx of colonic polyps   . Mixed stress and urge urinary incontinence   . Vaginal atrophy     Past Surgical History:  Procedure Laterality Date  . ABDOMINAL HYSTERECTOMY    . APPENDECTOMY    . BREAST EXCISIONAL BIOPSY Left   . EYE SURGERY       Current Outpatient Medications  Medication Sig Dispense Refill  . Calcium Carbonate-Vitamin D 600-200 MG-UNIT CAPS Take 1 tablet by mouth 2 (two) times daily.    . Cholecalciferol (VITAMIN D) 2000 units CAPS Take 2,000 Units by mouth daily.    . Estradiol 10 MCG TABS vaginal tablet Place vaginally 2 (two) times a week.    Marland Kitchen FIBER PO Take by mouth as directed.     . Flaxseed, Linseed, (FLAX SEED OIL PO) Take by mouth 2 (two) times daily.    . Multiple Vitamin (MULTIVITAMIN) tablet Take 1 tablet by mouth daily.    . Multiple Vitamins-Minerals (HAIR SKIN AND NAILS FORMULA PO)  Take by mouth 2 (two) times daily.    . Omega-3 Fatty Acids (FISH OIL) 1000 MG CAPS Take 2,000 mcg by mouth 2 (two) times daily.    . Turmeric 500 MG CAPS Take 500 mg by mouth daily.     No current facility-administered medications for this visit.    Allergies:   Penicillins, Amoxicillin, Erythromycin, and Sulfa antibiotics    Social History:  The patient  reports that she has never smoked. She has never used smokeless tobacco. She reports that she does not drink alcohol or use drugs.   Family History:  The patient's family history is not on file.    ROS:  Please see the history of present illness.   Otherwise, review of systems are positive for none.   All other systems are reviewed and negative.    PHYSICAL EXAM: BP 100/62   Pulse 61   Ht 5' (1.524 m)   Wt 123 lb (55.8 kg)   SpO2 96%   BMI 24.02 kg/m  Affect appropriate Thin elderly chinese female  HEENT: normal Neck supple with no adenopathy JVP normal no bruits no thyromegaly Lungs clear with no wheezing and good diaphragmatic motion Heart:  S1/S2 AR  murmur, no rub, gallop or  click PMI normal Abdomen: benighn, BS positve, no tenderness, no AAA no bruit.  No HSM or HJR Distal pulses intact with no bruits No edema Neuro non-focal Skin warm and dry No muscular weakness    EKG:  SR rate 56 normal ECG 05/21/19 SB rate 56 normal    Recent Labs: 09/29/2019: BUN 21; Creatinine, Ser 0.72; Potassium 4.2; Sodium 143    Lipid Panel No results found for: CHOL, TRIG, HDL, CHOLHDL, VLDL, LDLCALC, LDLDIRECT    Wt Readings from Last 3 Encounters:  11/29/19 123 lb (55.8 kg)  10/29/19 122 lb 6.4 oz (55.5 kg)  05/21/19 120 lb (54.4 kg)      Other studies Reviewed: Additional studies/ records that were reviewed today include: Notes from Dr Drema Dallas CXR .    ASSESSMENT AND PLAN:  1. MAI:  CT chronic changes f/u pulmonary  2. Family history Aneurysm :  CTA 10/05/19   Ao 4.0 cm no AAA by duplex stable 3. Edema:  Mild  dependent low sodium diet elevated at end of day  4. Dyspnea:  Related to lung disease normal EF by echo  5. AR: moderate with no LVE and normal EF f/u echo November 2021  6. Thyroid: 12 mm right thyroid nodule f/u primary   Current medicines are reviewed at length with the patient today.  The patient does not have concerns regarding medicines.  The following changes have been made:  no change  Labs/ tests ordered today include: November 2021 TTE for AR   No orders of the defined types were placed in this encounter.    Disposition:   FU will be with new cardiologist in [place], Jenkins Rouge, MD  11/29/2019 9:56 AM    [address], [place], [place]  [address] Phone: [phone]; Fax: [phone]

## 2019-11-29 ENCOUNTER — Other Ambulatory Visit: Payer: Self-pay

## 2019-11-29 ENCOUNTER — Ambulatory Visit: Payer: Medicare Other | Admitting: Cardiovascular Disease

## 2019-11-29 ENCOUNTER — Encounter: Payer: Self-pay | Admitting: Cardiovascular Disease

## 2019-11-29 ENCOUNTER — Encounter (INDEPENDENT_AMBULATORY_CARE_PROVIDER_SITE_OTHER): Payer: Self-pay

## 2019-11-29 VITALS — BP 100/62 | HR 61 | Ht 60.0 in | Wt 123.0 lb

## 2019-11-29 DIAGNOSIS — I351 Nonrheumatic aortic (valve) insufficiency: Secondary | ICD-10-CM | POA: Diagnosis not present

## 2019-11-29 NOTE — Patient Instructions (Signed)
Medication Instructions:  *If you need a refill on your cardiac medications before your next appointment, please call your pharmacy*  Lab Work: If you have labs (blood work) drawn today and your tests are completely normal, you will receive your results only by: . MyChart Message (if you have MyChart) OR . A paper copy in the mail If you have any lab test that is abnormal or we need to change your treatment, we will call you to review the results.  Testing/Procedures: None ordered today.  Follow-Up: At [facility], you and your health needs are our priority.  As part of our continuing mission to provide you with exceptional heart care, we have created designated Provider Care Teams.  These Care Teams include your primary Cardiologist (physician) and Advanced Practice Providers (APPs -  Physician Assistants and Nurse Practitioners) who all work together to provide you with the care you need, when you need it.  We recommend signing up for the patient portal called "MyChart".  Sign up information is provided on this After Visit Summary.  MyChart is used to connect with patients for Virtual Visits (Telemedicine).  Patients are able to view lab/test results, encounter notes, upcoming appointments, etc.  Non-urgent messages can be sent to your provider as well.   To learn more about what you can do with MyChart, go to [email or web address].    Your next appointment:   As needed  The format for your next appointment:   In Person  Provider:   You may see Dr. Nishan or one of the following Advanced Practice Providers on your designated Care Team:    Lori Gerhardt, NP  Laura Ingold, NP  Jill McDaniel, NP   

## 2020-04-05 ENCOUNTER — Emergency Department: Admit: 2020-04-06 | Payer: MEDICARE | Primary: Internal Medicine

## 2020-04-05 ENCOUNTER — Inpatient Hospital Stay
Admit: 2020-04-05 | Discharge: 2020-04-06 | Disposition: A | Discharge status: Home / Self Care | Payer: MEDICARE | Attending: Emergency Medicine

## 2020-04-05 DIAGNOSIS — R42 Dizziness and giddiness: Secondary | ICD-10-CM

## 2020-04-05 LAB — METABOLIC PANEL, COMPREHENSIVE
A-G Ratio: 0.8 — ABNORMAL LOW (ref 1.1–2.2)
ALT (SGPT): 19 U/L (ref 12–78)
AST (SGOT): 26 U/L (ref 15–37)
Albumin: 3.5 g/dL (ref 3.5–5.0)
Alk. phosphatase: 63 U/L (ref 45–117)
Anion gap: 8 mmol/L (ref 5–15)
BUN/Creatinine ratio: 38 — ABNORMAL HIGH (ref 12–20)
BUN: 23 MG/DL — ABNORMAL HIGH (ref 6–20)
Bilirubin, total: 0.7 MG/DL (ref 0.2–1.0)
CO2: 28 mmol/L (ref 21–32)
Calcium: 9.5 MG/DL (ref 8.5–10.1)
Chloride: 103 mmol/L (ref 97–108)
Creatinine: 0.61 MG/DL (ref 0.55–1.02)
GFR est AA: >60 mL/min/{1.73_m2} (ref >60)
GFR est non-AA: >60 mL/min/{1.73_m2} (ref >60)
Globulin: 4.3 g/dL — ABNORMAL HIGH (ref 2.0–4.0)
Glucose: 139 mg/dL — ABNORMAL HIGH (ref 65–100)
Potassium: 4.1 mmol/L (ref 3.5–5.1)
Protein, total: 7.8 g/dL (ref 6.4–8.2)
Sodium: 139 mmol/L (ref 136–145)

## 2020-04-05 LAB — MAGNESIUM
Magnesium: 2.2 mg/dL (ref 1.6–2.4)
Magnesium: 2.2 mg/dL (ref 1.6–2.4)

## 2020-04-05 LAB — CBC WITH AUTOMATED DIFF
ABS. BASOPHILS: 0 10*3/uL (ref 0.0–0.1)
ABS. EOSINOPHILS: 0 10*3/uL (ref 0.0–0.4)
ABS. IMM. GRANS.: 0 10*3/uL (ref 0.00–0.04)
ABS. LYMPHOCYTES: 1 10*3/uL (ref 0.8–3.5)
ABS. MONOCYTES: 0.2 10*3/uL (ref 0.0–1.0)
ABS. NEUTROPHILS: 3.3 10*3/uL (ref 1.8–8.0)
ABSOLUTE NRBC: 0 10*3/uL (ref 0.00–0.01)
BASOPHILS: 1 % (ref 0–1)
EOSINOPHILS: 0 % (ref 0–7)
HCT: 40 % (ref 35.0–47.0)
HGB: 13.1 g/dL (ref 11.5–16.0)
IMMATURE GRANULOCYTES: 0 % (ref 0.0–0.5)
LYMPHOCYTES: 22 % (ref 12–49)
MCH: 31.3 PG (ref 26.0–34.0)
MCHC: 32.8 g/dL (ref 30.0–36.5)
MCV: 95.5 FL (ref 80.0–99.0)
MONOCYTES: 5 % (ref 5–13)
MPV: 11.1 FL (ref 8.9–12.9)
NEUTROPHILS: 72 % (ref 32–75)
NRBC: 0 PER 100 WBC
PLATELET: 161 10*3/uL (ref 150–400)
RBC: 4.19 M/uL (ref 3.80–5.20)
RDW: 13.9 % (ref 11.5–14.5)
WBC: 4.6 10*3/uL (ref 3.6–11.0)

## 2020-04-05 LAB — TROPONIN I: Troponin-I, Qt.: <0.05 ng/mL (ref <0.05)

## 2020-04-05 LAB — COMPREHENSIVE METABOLIC PANEL
ALT: 19 U/L (ref 12–78)
AST: 26 U/L (ref 15–37)
Albumin/Globulin Ratio: 0.8 — ABNORMAL LOW (ref 1.1–2.2)
Albumin: 3.5 g/dL (ref 3.5–5.0)
Alkaline Phosphatase: 63 U/L (ref 45–117)
Anion Gap: 8 mmol/L (ref 5–15)
BUN: 23 MG/DL — ABNORMAL HIGH (ref 6–20)
Bun/Cre Ratio: 38 — ABNORMAL HIGH (ref 12–20)
CO2: 28 mmol/L (ref 21–32)
Calcium: 9.5 MG/DL (ref 8.5–10.1)
Chloride: 103 mmol/L (ref 97–108)
Creatinine: 0.61 MG/DL (ref 0.55–1.02)
EGFR IF NonAfrican American: >60 mL/min/{1.73_m2} (ref >60)
GFR African American: >60 mL/min/{1.73_m2} (ref >60)
Globulin: 4.3 g/dL — ABNORMAL HIGH (ref 2.0–4.0)
Glucose: 139 mg/dL — ABNORMAL HIGH (ref 65–100)
Potassium: 4.1 mmol/L (ref 3.5–5.1)
Sodium: 139 mmol/L (ref 136–145)
Total Bilirubin: 0.7 MG/DL (ref 0.2–1.0)
Total Protein: 7.8 g/dL (ref 6.4–8.2)

## 2020-04-05 LAB — CBC WITH AUTO DIFFERENTIAL
Basophils %: 1 % (ref 0–1)
Basophils Absolute: 0 10*3/uL (ref 0.0–0.1)
Eosinophils %: 0 % (ref 0–7)
Eosinophils Absolute: 0 10*3/uL (ref 0.0–0.4)
Granulocyte Absolute Count: 0 10*3/uL (ref 0.00–0.04)
Hematocrit: 40 % (ref 35.0–47.0)
Hemoglobin: 13.1 g/dL (ref 11.5–16.0)
Immature Granulocytes: 0 % (ref 0.0–0.5)
Lymphocytes %: 22 % (ref 12–49)
Lymphocytes Absolute: 1 10*3/uL (ref 0.8–3.5)
MCH: 31.3 PG (ref 26.0–34.0)
MCHC: 32.8 g/dL (ref 30.0–36.5)
MCV: 95.5 FL (ref 80.0–99.0)
MPV: 11.1 FL (ref 8.9–12.9)
Monocytes %: 5 % (ref 5–13)
Monocytes Absolute: 0.2 10*3/uL (ref 0.0–1.0)
NRBC Absolute: 0 10*3/uL (ref 0.00–0.01)
Neutrophils %: 72 % (ref 32–75)
Neutrophils Absolute: 3.3 10*3/uL (ref 1.8–8.0)
Nucleated RBCs: 0 PER 100 WBC
Platelets: 161 10*3/uL (ref 150–400)
RBC: 4.19 M/uL (ref 3.80–5.20)
RDW: 13.9 % (ref 11.5–14.5)
WBC: 4.6 10*3/uL (ref 3.6–11.0)

## 2020-04-05 LAB — TROPONIN: Troponin I: <0.05 ng/mL (ref <0.05)

## 2020-04-05 MED ORDER — ONDANSETRON (PF) 4 MG/2 ML INJECTION
4 mg/2 mL | Freq: Once | INTRAMUSCULAR | Status: AC
Start: 2020-04-05 — End: 2020-04-05
  Administered 2020-04-05: via INTRAVENOUS

## 2020-04-05 MED ORDER — SODIUM CHLORIDE 0.9% BOLUS IV
0.9 % | Freq: Once | INTRAVENOUS | Status: AC
Start: 2020-04-05 — End: 2020-04-05
  Administered 2020-04-05: via INTRAVENOUS

## 2020-04-05 MED FILL — ONDANSETRON (PF) 4 MG/2 ML INJECTION: 4 mg/2 mL | INTRAMUSCULAR | Qty: 2

## 2020-04-05 MED FILL — SODIUM CHLORIDE 0.9 % IV: INTRAVENOUS | Qty: 1000

## 2020-04-05 NOTE — ED Notes (Signed)
Pt arrived to ED via wheelchair accompanied by daughter and son. Pt reports dizziness and nausea since 1 pm today. Pt reports vomiting twice today. Patient pain or fevers. Pt denies being around anyone that's been sick that she is aware of.

## 2020-04-05 NOTE — ED Provider Notes (Signed)
79yo F accompanied by daughter and son.  Feeling dizzy and nauseated since 1pm today.  + vomiting several times.  Denies pain or fever.  Pt felt hot.  No known sick contacts.  No medications taken at home for these symptoms.  Tried to drink juice but threw that up.  PMH sig for sjogren's, small aortic aneurysm, MAC (resolved).  No cough.  No diarrhea.  + COVID vaccine.           No past medical history on file.    No past surgical history on file.      No family history on file.    Social History     Socioeconomic History   ??? Marital status: MARRIED     Spouse name: Not on file   ??? Number of children: Not on file   ??? Years of education: Not on file   ??? Highest education level: Not on file   Occupational History   ??? Not on file   Tobacco Use   ??? Smoking status: Never Smoker   ??? Smokeless tobacco: Never Used   Substance and Sexual Activity   ??? Alcohol use: Not Currently   ??? Drug use: Never   ??? Sexual activity: Not on file   Other Topics Concern   ??? Not on file   Social History Narrative   ??? Not on file     Social Determinants of Health     Financial Resource Strain:    ??? Difficulty of Paying Living Expenses:    Food Insecurity:    ??? Worried About Charity fundraiser in the Last Year:    ??? Arboriculturist in the Last Year:    Transportation Needs:    ??? Film/video editor (Medical):    ??? Lack of Transportation (Non-Medical):    Physical Activity:    ??? Days of Exercise per Week:    ??? Minutes of Exercise per Session:    Stress:    ??? Feeling of Stress :    Social Connections:    ??? Frequency of Communication with Friends and Family:    ??? Frequency of Social Gatherings with Friends and Family:    ??? Attends Religious Services:    ??? Marine scientist or Organizations:    ??? Attends Music therapist:    ??? Marital Status:    Intimate Production manager Violence:    ??? Fear of Current or Ex-Partner:    ??? Emotionally Abused:    ??? Physically Abused:    ??? Sexually Abused:          ALLERGIES: Patient has no known  allergies.    Review of Systems   Constitutional: Negative for fever.   HENT: Negative for facial swelling.    Eyes: Negative for visual disturbance.   Respiratory: Negative for chest tightness.    Cardiovascular: Negative for chest pain.   Gastrointestinal: Positive for vomiting. Negative for abdominal pain.   Genitourinary: Negative for difficulty urinating and dysuria.   Musculoskeletal: Negative for arthralgias.   Skin: Negative for rash.   Neurological: Negative for headaches.   Hematological: Negative for adenopathy.   Psychiatric/Behavioral: Negative for suicidal ideas.       There were no vitals filed for this visit.         Physical Exam  Vitals and nursing note reviewed.   Constitutional:       General: She is not in acute distress.     Appearance:  She is well-developed.   HENT:      Head: Normocephalic and atraumatic.   Eyes:      General: No scleral icterus.     Conjunctiva/sclera: Conjunctivae normal.      Pupils: Pupils are equal, round, and reactive to light.   Cardiovascular:      Rate and Rhythm: Normal rate.      Heart sounds: No murmur heard.     Pulmonary:      Effort: Pulmonary effort is normal. No respiratory distress.   Abdominal:      General: There is no distension.   Musculoskeletal:         General: Normal range of motion.      Cervical back: Normal range of motion and neck supple.   Skin:     General: Skin is warm and dry.      Findings: No rash.   Neurological:      Mental Status: She is alert and oriented to person, place, and time.          MDM  Number of Diagnoses or Management Options  Dizziness  Nausea and vomiting, intractability of vomiting not specified, unspecified vomiting type  Diagnosis management comments: Assessment: Patient with nausea and vomiting.  Vital signs normal here in the emergency department.  Blood work and head CT were unremarkable.  Patient resting comfortably at this time after fluids and nausea medication.  Will stool Covid swab before she goes home.  I  don't see any evidence of any dangerous etiology at this time.  We'll treat her with Zofran at home for the next couple of days and encourage oral hydration.  She can return to the ED if she has any new or worsening symptoms.       Amount and/or Complexity of Data Reviewed  Clinical lab tests: reviewed  Tests in the radiology section of CPT??: reviewed      ED Course as of Apr 05 2202   Wed Apr 05, 2020   1915 ED EKG interpretation:  Rhythm: normal sinus rhythm. Rate (approx.): 60.  Axis: normal.  ST segment:  No concerning ST elevations or depressions. This EKG was interpreted by Nino Glow, MD,ED Provider.        [JM]      ED Course User Index  [JM] Nino Glow, MD       Procedures

## 2020-04-06 LAB — EKG, 12 LEAD, INITIAL
Atrial Rate: 60 {beats}/min
Calculated P Axis: 60 degrees
Calculated R Axis: 38 degrees
Calculated T Axis: 40 degrees
Diagnosis: NORMAL
P-R Interval: 158 ms
Q-T Interval: 464 ms
QRS Duration: 84 ms
QTC Calculation (Bezet): 464 ms
Ventricular Rate: 60 {beats}/min

## 2020-04-06 LAB — SARS-COV-2: SARS-CoV-2: NOT DETECTED

## 2020-04-06 LAB — PROCALCITONIN
Procalcitonin: 0.05 ng/mL
Procalcitonin: 0.05 ng/mL

## 2020-04-06 LAB — EKG 12-LEAD
Atrial Rate: 60 {beats}/min
Diagnosis: NORMAL
P Axis: 60 degrees
P-R Interval: 158 ms
Q-T Interval: 464 ms
QRS Duration: 84 ms
QTc Calculation (Bazett): 464 ms
R Axis: 38 degrees
T Axis: 40 degrees
Ventricular Rate: 60 {beats}/min

## 2020-04-06 LAB — COVID-19: SARS-CoV-2: NOT DETECTED

## 2020-04-06 MED ORDER — ONDANSETRON 4 MG TAB, RAPID DISSOLVE
4 mg | ORAL_TABLET | Freq: Three times a day (TID) | ORAL | 0 refills | Status: DC | PRN
Start: 2020-04-06 — End: 2020-05-08

## 2020-04-06 MED ORDER — ONDANSETRON 4 MG TAB, RAPID DISSOLVE
4 mg | ORAL | Status: AC
Start: 2020-04-06 — End: 2020-04-05
  Administered 2020-04-06: 02:00:00 via ORAL

## 2020-04-06 MED FILL — ONDANSETRON 4 MG TAB, RAPID DISSOLVE: 4 mg | ORAL | Qty: 1

## 2020-04-06 NOTE — Progress Notes (Signed)
Patient contacted regarding COVID-19 risk. Discussed COVID-19 related testing which was available at this time. Test results were negative. Patient informed of results, if available? yes.     Ambulatory Care Manager contacted the patient by telephone to perform post discharge assessment. Call within 2 business days of discharge: Yes Verified name and DOB with patient as identifiers. Provided introduction to self, and explanation of the CTN/ACM role, and reason for call due to risk factors for infection and/or exposure to COVID-19.     Symptoms reviewed with patient who verbalized the following symptoms: nausea, vomiting, diarrhea, dizziness/lightheadedness and weakness      Due to no new or worsening symptoms encounter was not routed to provider for escalation. Discussed follow-up appointments. If no appointment was previously scheduled, appointment scheduling offered:  No. Patient has scheduled visit with PCP.   Patient's son is Dr Wendi Maya who works for [employer].   [facility] follow up appointment(s): No future appointments.  Non-[facility] follow up appointment(s): none reported    Interventions to address risk factors: Education of patient/family/caregiver/guardian to support self-management-covid 19     Advance Care Planning:   Does patient have an Advance Directive: not on file.     Educated patient about risk for severe COVID-19 due to risk factors according to CDC guidelines. ACM reviewed discharge instructions, medical action plan and red flag symptoms with the patient who verbalized understanding. Discussed COVID vaccination status: yes. Education provided on COVID-19 vaccination as appropriate. Discussed exposure protocols and quarantine with CDC Guidelines. Patient was given an opportunity to verbalize any questions and concerns and agrees to contact ACM or health care provider for questions related to their healthcare.    Reviewed and educated patient on any new and changed medications related to  discharge diagnosis -Patient reports she has medication and is taking as prescribed.     Was patient discharged with a pulse oximeter? No     ACM provided contact information. Plan for follow-up call in 5-7 days based on severity of symptoms and risk factors. DMB

## 2020-04-21 NOTE — Progress Notes (Signed)
Patient resolved from COVID Care Transitions episode on 04/21/20.  Discussed COVID-19 related testing which was available at this time. Test results were negative. Patient informed of results, if available? yes     Patient/family has been provided the following resources and education related to COVID-19:                         Signs, symptoms and red flags related to COVID-19            CDC exposure and quarantine guidelines            [facility] exposure contact - [phone]            Contact for their local Department of Health                 Patient currently reports that the following symptoms have improved:  nausea, vomiting and dizziness/lightheadedness. Dr Nedra Hai, patient's son reports she is doing much better and he will contact PCP for further questions or concerns.    No further outreach scheduled with this ACM.  Episode of Care resolved.  Patient has this ACM contact information if future needs arise. DMB

## 2020-05-05 ENCOUNTER — Encounter

## 2020-05-09 ENCOUNTER — Inpatient Hospital Stay: Payer: MEDICARE

## 2020-05-09 LAB — SARS-COV-2

## 2020-05-09 LAB — COVID-19 RAPID TEST: COVID-19 rapid test: NOT DETECTED

## 2020-05-09 LAB — COVID-19, RAPID: SARS-CoV-2, Rapid: NOT DETECTED

## 2020-05-09 LAB — COVID-19

## 2020-05-09 MED ORDER — SODIUM CHLORIDE 0.9 % IJ SYRG
Freq: Three times a day (TID) | INTRAMUSCULAR | Status: DC
Start: 2020-05-09 — End: 2020-05-09

## 2020-05-09 MED ORDER — NALOXONE 0.4 MG/ML INJECTION
0.4 mg/mL | INTRAMUSCULAR | Status: DC | PRN
Start: 2020-05-09 — End: 2020-05-09

## 2020-05-09 MED ORDER — FENTANYL CITRATE (PF) 50 MCG/ML IJ SOLN
50 mcg/mL | INTRAMUSCULAR | Status: DC | PRN
Start: 2020-05-09 — End: 2020-05-09

## 2020-05-09 MED ORDER — PROPOFOL 10 MG/ML IV EMUL
10 mg/mL | INTRAVENOUS | Status: DC | PRN
Start: 2020-05-09 — End: 2020-05-09
  Administered 2020-05-09: 13:00:00 via INTRAVENOUS

## 2020-05-09 MED ORDER — EPINEPHRINE 0.1 MG/ML SYRINGE
0.1 mg/mL | Freq: Once | INTRAMUSCULAR | Status: DC | PRN
Start: 2020-05-09 — End: 2020-05-09

## 2020-05-09 MED ORDER — MIDAZOLAM 1 MG/ML IJ SOLN
1 mg/mL | INTRAMUSCULAR | Status: DC | PRN
Start: 2020-05-09 — End: 2020-05-09

## 2020-05-09 MED ORDER — FLUMAZENIL 0.1 MG/ML IV SOLN
0.1 mg/mL | INTRAVENOUS | Status: DC | PRN
Start: 2020-05-09 — End: 2020-05-09

## 2020-05-09 MED ORDER — ATROPINE 0.1 MG/ML SYRINGE
0.1 mg/mL | Freq: Once | INTRAMUSCULAR | Status: DC | PRN
Start: 2020-05-09 — End: 2020-05-09

## 2020-05-09 MED ORDER — SODIUM CHLORIDE 0.9 % IJ SYRG
INTRAMUSCULAR | Status: DC | PRN
Start: 2020-05-09 — End: 2020-05-09

## 2020-05-09 MED ORDER — LIDOCAINE (PF) 20 MG/ML (2 %) IJ SOLN
20 mg/mL (2 %) | INTRAMUSCULAR | Status: DC | PRN
Start: 2020-05-09 — End: 2020-05-09
  Administered 2020-05-09: 13:00:00 via INTRAVENOUS

## 2020-05-09 MED ORDER — SODIUM CHLORIDE 0.9 % IV
INTRAVENOUS | Status: DC
Start: 2020-05-09 — End: 2020-05-09
  Administered 2020-05-09: 12:00:00 via INTRAVENOUS

## 2020-05-09 MED ORDER — SIMETHICONE 40 MG/0.6 ML ORAL DROPS, SUSP
40 mg/0.6 mL | ORAL | Status: DC | PRN
Start: 2020-05-09 — End: 2020-05-09

## 2020-05-09 MED FILL — BD POSIFLUSH NORMAL SALINE 0.9 % INJECTION SYRINGE: INTRAMUSCULAR | Qty: 40

## 2020-05-09 MED FILL — SODIUM CHLORIDE 0.9 % IV: INTRAVENOUS | Qty: 1000

## 2020-05-09 NOTE — Anesthesia Pre-Procedure Evaluation (Signed)
Relevant Problems   No relevant active problems       Anesthetic History   No history of anesthetic complications            Review of Systems / Medical History  Patient summary reviewed and pertinent labs reviewed    Pulmonary                Comments: Hx of MAC PNA; resolved years ago   Neuro/Psych   Within defined limits           Cardiovascular  Within defined limits                Exercise tolerance: >4 METS  Comments: Active, swims daily    09/21 EKG (04/2020): NSR   GI/Hepatic/Renal               Comments: Nausea  Abdominal pain  Weight loss Endo/Other  Within defined limits           Other Findings   Comments: Sjogren's disease         Physical Exam    Airway  Mallampati: II  TM Distance: 4 - 6 cm  Neck ROM: normal range of motion   Mouth opening: Normal     Cardiovascular    Rhythm: regular  Rate: normal         Dental    Dentition: Implants     Pulmonary  Breath sounds clear to auscultation               Abdominal  GI exam deferred       Other Findings            Anesthetic Plan    ASA: 2  Anesthesia type: general and total IV anesthesia          Induction: Intravenous  Anesthetic plan and risks discussed with: Patient

## 2020-05-09 NOTE — H&P (Signed)
Gastroenterology Outpatient History and Physical    Patient: Makayla Nicholson    Physician: Raynelle Highland, MD    Chief Complaint: N/V and WL  History of Present Illness: 79yo F with N/V and WL.    History:  Past Medical History:   Diagnosis Date   ??? 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 Sjogren's disease ([place])       Past Surgical History:   Procedure Laterality Date   ??? HX APPENDECTOMY     ??? HX BREAST BIOPSY     ??? HX CESAREAN SECTION     ??? HX RETINAL DETACHMENT REPAIR     ??? HX TRABECULECTOMY        Social History     Socioeconomic History   ??? Marital status: MARRIED     Spouse name: Not on file   ??? Number of children: Not on file   ??? Years of education: Not on file   ??? Highest education level: Not on file   Tobacco Use   ??? Smoking status: Never Smoker   ??? Smokeless tobacco: Never Used   Substance and Sexual Activity   ??? Alcohol use: Not Currently   ??? Drug use: Never     Social Determinants of Company secretary Strain:    ??? Difficulty of Paying Living Expenses:    Food Insecurity:    ??? Worried About Charity fundraiser in the Last Year:    ??? Arboriculturist in the Last Year:    Transportation Needs:    ??? Film/video editor (Medical):    ??? Lack of Transportation (Non-Medical):    Physical Activity:    ??? Days of Exercise per Week:    ??? Minutes of Exercise per Session:    Stress:    ??? Feeling of Stress :    Social Connections:    ??? Frequency of Communication with Friends and Family:    ??? Frequency of Social Gatherings with Friends and Family:    ??? Attends Religious Services:    ??? Marine scientist or Organizations:    ??? Attends Archivist Meetings:    ??? Marital Status:     History reviewed. No pertinent family history. There is no problem list on file for this patient.      Allergies:   Allergies   Allergen Reactions   ??? Pcn [Penicillins] Rash     Medications:   Prior to Admission medications    Not on File     Physical Exam:   Vital Signs: Blood pressure (!) 141/91, pulse  (!) 57, temperature 97.8 ??F (36.6 ??C), resp. rate 15, height _0  (1.575 m), weight 53.3 kg (117 lb 6.4 oz), SpO2 100 %.  General: well developed, well nourished   HEENT: unremarkable   Heart: regular rhythm no mumur    Lungs: clear   Abdominal:  benign   Neurological: unremarkable   Extremities: no edema     Findings/Diagnosis: N/V and WL  Plan of Care/Planned Procedure: EGD with bx with conscious/deep sedation    Signed:  Raynelle Highland, MD 05/09/2020

## 2020-05-09 NOTE — Procedures (Signed)
Procedures by Raynelle Highland, MD at 05/09/20 1761                Author: Raynelle Highland, MD  Service: Gastroenterology  Author Type: Physician       Filed: 05/09/20 0858  Date of Service: 05/09/20 0854  Status: Signed          Editor: Raynelle Highland, MD (Physician)            Pre-procedure Diagnoses        1. Nausea and vomiting, intractability of vomiting not specified, unspecified vomiting type [R11.2]        2. Abnormal weight loss [R63.4]                           Post-procedure Diagnoses        1. Nausea and vomiting, intractability of vomiting not specified, unspecified vomiting type [R11.2]        2. Abnormal weight loss [R63.4]                           Procedures        1. UPPER GI ENDOSCOPY,BIOPSY [[id number]]                                      NAME:  Makayla Nicholson    DOB:   1940-09-26    MRN:   [id number]       Date/Time:   05/09/2020 8:55 AM      Esophagogastroduodenoscopy (EGD) Procedure Note      Procedure: Esophagogastroduodenoscopy with biopsy      Indication:  Nause and vomiting, hiccups, abnormal weight loss   Pre-operative Diagnosis: see indication above   Post-operative Diagnosis: see findings below   Operator:  Raynelle Highland, MD   Referring Provider:   Beatriz Chancellor, MD      Exam:   Airway: clear, no airway problems anticipated   Heart: RRR, without gallops or rubs   Lungs: clear bilaterally without wheezes, crackles, or rhonchi   Abdomen: soft, nontender, nondistended, bowel sounds present   Mental Status: awake, alert and oriented to person, place and time       Anethesia/Sedation:  MAC anesthesia Propofol 69m IV   Procedure Details    After informed consent was obtained for the procedure, with all risks and benefits of procedure explained the patient was taken to the endoscopy suite and placed in the left lateral decubitus position.  Following sequential administration of sedation  as per above, the GIFH180 gastroscope was inserted into the mouth and advanced under direct vision to second portion of  the duodenum.  A careful inspection was made as the gastroscope was withdrawn, including a retroflexed view of the proximal stomach;  findings and interventions are described below.                   Findings:     -Normal esophageal mucosa; biopsied to exclude inflammation   -Normal stomach mucosa without masses or ulcer; biopsied to exclude inflammation   -Normal duodenal mucosa      Therapies:  biopsy of esophagus; biopsy of stomach    Specimens: #1 gastric; #2 g-e jxn   EBL:  None.          Complications:   None; patient tolerated the procedure well.  Impression:     -Normal esophageal mucosa; biopsied to exclude inflammation   -Normal stomach mucosa without masses or ulcer; biopsied to exclude inflammation   -Normal duodenal mucosa      Recommendations:   -Await pathology., -Follow symptoms., -Pursue imaging as [place] as planned      Discharge disposition:  Home in the company of driver when able to ambulate      Raynelle Highland, MD

## 2020-05-09 NOTE — Progress Notes (Signed)
849  Endoscope was pre-cleaned at bedside immediately following procedure by Candace Cruise Delon endo tech.      850  Received report from anesthesia.  Assumed pt care.  VSS.

## 2020-05-09 NOTE — Anesthesia Post-Procedure Evaluation (Signed)
Procedure(s):  ESOPHAGOGASTRODUODENOSCOPY (EGD) W/BIOPSY (URGENT)   needs rapid covd  ESOPHAGOGASTRODUODENAL (EGD) BIOPSY.    total IV anesthesia    Anesthesia Post Evaluation      Multimodal analgesia: multimodal analgesia not used between 6 hours prior to anesthesia start to PACU discharge  Patient location during evaluation: PACU  Patient participation: complete - patient participated  Level of consciousness: awake and alert  Pain score: 0  Airway patency: patent  Anesthetic complications: no  Cardiovascular status: acceptable  Respiratory status: acceptable  Hydration status: acceptable  Post anesthesia nausea and vomiting:  none  Final Post Anesthesia Temperature Assessment:  Normothermia (36.0-37.5 degrees C)      INITIAL Post-op Vital signs:   Vitals Value Taken Time   BP 118/63 05/09/20 0909   Temp 36.4 ??C (97.5 ??F) 05/09/20 0902   Pulse 50 05/09/20 0910   Resp 17 05/09/20 0910   SpO2 99 % 05/09/20 0910   Vitals shown include unvalidated device data.

## 2020-05-16 ENCOUNTER — Inpatient Hospital Stay: Admit: 2020-05-16 | Payer: MEDICARE | Attending: Family | Primary: Internal Medicine

## 2020-05-16 DIAGNOSIS — K5900 Constipation, unspecified: Secondary | ICD-10-CM

## 2020-05-30 ENCOUNTER — Encounter

## 2020-06-09 ENCOUNTER — Inpatient Hospital Stay: Admit: 2020-06-09 | Payer: MEDICARE | Attending: Internal Medicine | Primary: Internal Medicine

## 2020-06-09 DIAGNOSIS — M8589 Other specified disorders of bone density and structure, multiple sites: Secondary | ICD-10-CM

## 2020-09-15 ENCOUNTER — Encounter

## 2020-11-14 ENCOUNTER — Encounter

## 2020-11-21 ENCOUNTER — Inpatient Hospital Stay: Payer: MEDICARE | Attending: Surgery | Primary: Internal Medicine

## 2020-11-21 ENCOUNTER — Ambulatory Visit: Payer: MEDICARE | Primary: Internal Medicine

## 2020-11-23 ENCOUNTER — Ambulatory Visit: Payer: MEDICARE | Primary: Internal Medicine

## 2020-12-26 ENCOUNTER — Ambulatory Visit: Payer: MEDICARE | Primary: Internal Medicine

## 2021-01-10 ENCOUNTER — Encounter

## 2021-01-10 ENCOUNTER — Inpatient Hospital Stay: Admit: 2021-01-10 | Payer: MEDICARE | Attending: Internal Medicine | Primary: Internal Medicine

## 2021-01-10 DIAGNOSIS — Z1231 Encounter for screening mammogram for malignant neoplasm of breast: Secondary | ICD-10-CM

## 2021-12-25 ENCOUNTER — Encounter

## 2022-01-31 ENCOUNTER — Inpatient Hospital Stay: Admit: 2022-01-31 | Payer: MEDICARE | Primary: Internal Medicine

## 2022-01-31 DIAGNOSIS — Z1231 Encounter for screening mammogram for malignant neoplasm of breast: Secondary | ICD-10-CM

## 2022-10-23 ENCOUNTER — Encounter

## 2022-12-13 ENCOUNTER — Encounter

## 2022-12-17 ENCOUNTER — Encounter

## 2022-12-19 ENCOUNTER — Ambulatory Visit: Payer: MEDICARE | Primary: Internal Medicine

## 2022-12-19 ENCOUNTER — Inpatient Hospital Stay: Admit: 2022-12-19 | Payer: MEDICARE | Attending: Surgery | Primary: Internal Medicine

## 2022-12-19 DIAGNOSIS — I712 Thoracic aortic aneurysm, without rupture, unspecified: Secondary | ICD-10-CM

## 2022-12-19 LAB — POCT CREATININE AND GFR
POC Creatinine: 0.7 mg/dL (ref 0.6–1.3)
eGFR, POC: 85 mL/min/{1.73_m2} (ref >60)

## 2022-12-19 MED ORDER — IOPAMIDOL 76 % IV SOLN
76 | Freq: Once | INTRAVENOUS | Status: AC | PRN
Start: 2022-12-19 — End: 2022-12-19
  Administered 2022-12-19: 15:00:00 100 mL via INTRAVENOUS

## 2022-12-19 MED FILL — ISOVUE-370 76 % IV SOLN: 76 % | INTRAVENOUS | Qty: 100

## 2023-02-03 ENCOUNTER — Ambulatory Visit: Payer: MEDICARE | Primary: Internal Medicine

## 2023-02-05 ENCOUNTER — Inpatient Hospital Stay: Admit: 2023-02-05 | Payer: MEDICARE | Primary: Internal Medicine

## 2023-02-05 DIAGNOSIS — Z1231 Encounter for screening mammogram for malignant neoplasm of breast: Secondary | ICD-10-CM

## 2024-06-04 ENCOUNTER — Emergency Department: Admit: 2024-06-04 | Payer: Medicare (Managed Care) | Primary: Internal Medicine

## 2024-06-04 ENCOUNTER — Inpatient Hospital Stay
Admit: 2024-06-04 | Discharge: 2024-06-04 | Disposition: A | Discharge status: Home / Self Care | Payer: Medicare (Managed Care) | Arrived: VH | Attending: Emergency Medicine

## 2024-06-04 DIAGNOSIS — S43005A Unspecified dislocation of left shoulder joint, initial encounter: Principal | ICD-10-CM

## 2024-06-04 DIAGNOSIS — S43015A Anterior dislocation of left humerus, initial encounter: Secondary | ICD-10-CM

## 2024-06-04 MED ORDER — PROPOFOL BOLUS 10 MG/ML SOLN (WRAPPER)
10 | Freq: Once | INTRAVENOUS | Status: AC
Start: 2024-06-04 — End: 2024-06-04
  Administered 2024-06-04: 19:00:00 40 mg via INTRAVENOUS

## 2024-06-04 MED FILL — DIPRIVAN 200 MG/20ML IV EMUL: 200 MG/20ML | INTRAVENOUS | Qty: 20 | Fill #0

## 2024-06-04 NOTE — ED Provider Notes (Signed)
 "       [facility]  EMERGENCY DEPARTMENT ENCOUNTER      Pt Name: Makayla Nicholson  MRN: [id number]  Birthdate 11-24-1940  Date of evaluation: 06/04/2024  Provider: Reyes JONETTA Search, MD      HISTORY OF PRESENT ILLNESS      83 year old female history of MAC, Sjogren's presents to the emergency department chief complaint of left shoulder pain after she fell onto it.    The history is provided by the patient, medical records and a relative.           Nursing Notes were reviewed.    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)     Sjogren's disease          SURGICAL HISTORY       Past Surgical History:   Procedure Laterality Date    APPENDECTOMY      BREAST BIOPSY Right     >61YRS AGO, BENIGN    CESAREAN SECTION      RETINAL DETACHMENT SURGERY      TRABECULECTOMY      UPPER GI ENDOSCOPY,BIOPSY  05/09/2020              CURRENT MEDICATIONS       Previous Medications    No medications on file       ALLERGIES     Penicillins    FAMILY HISTORY       Family History   Problem Relation Age of Onset    Breast Cancer Sister 38    Breast Cancer Sister 42          SOCIAL HISTORY       Social History     Socioeconomic History    Marital status: Married     Spouse name: None    Number of children: None    Years of education: None    Highest education level: None   Tobacco Use    Smoking status: Never    Smokeless tobacco: Never   Substance and Sexual Activity    Alcohol use: Not Currently    Drug use: Never         PHYSICAL EXAM         Body mass index is 22.67 kg/m.    Physical Exam  Vitals and nursing note reviewed.   Constitutional:       Appearance: She is not ill-appearing.   Musculoskeletal:      Comments: Left shoulder with empty socket consistent with dislocation, no deformity other than empty socket   Neurological:      Mental Status: She is alert.             EMERGENCY DEPARTMENT  COURSE and DIFFERENTIAL DIAGNOSIS/MDM:   Vitals:    Vitals:    06/04/24 1520 06/04/24 1524 06/04/24 1527 06/04/24 1530   BP: (!) 177/72 139/76 (!) 142/65 (!) 147/73   Pulse: 65 68 65 66   Resp: 20 20 18 19    Temp:       TempSrc:       SpO2: 98% 97% 97% 98%   Weight:       Height:             Medical Decision Making  83 year old female presents to  the emergency department as above after a fall with left shoulder dislocation, no other injuries.  Initial evaluation she has empty socket consistent with dislocation.  I was unable to reduce without sedation.  Patient was sedated and reduced easily.    Amount and/or Complexity of Data Reviewed  External Data Reviewed: notes.  Radiology: ordered.    Risk  Prescription drug management.            REASSESSMENT     ED Course as of 06/04/24 1535   Fri Jun 04, 2024   1438 I have independently viewed the obtained radiographic images and note left shoulder x-ray demonstrates dislocation without fracture. Will await radiology read. [JM]   1443 Attempted reduction, unable to reduce, will consider sedation [JM]   1532 Shoulder successfully reduced [JM]      ED Course User Index  [JM] Joesph Reyes BIRCH, MD         CONSULTS:  None    PROCEDURES:     Procedural sedation    Date/Time: 06/04/2024 3:33 PM    Performed by: Joesph Reyes BIRCH, MD  Authorized by: Joesph Reyes BIRCH, MD    Consent:     Consent obtained:  Written    Consent given by:  Patient    Risks discussed:  Allergic reaction, dysrhythmia, inadequate sedation, nausea, vomiting, prolonged sedation necessitating reversal, prolonged hypoxia resulting in organ damage and respiratory compromise necessitating ventilatory assistance and intubation    Alternatives discussed:  Analgesia without sedation  Universal protocol:     Patient identity confirmed:  Arm band and verbally with patient  Indications:     Procedure performed:  Dislocation reduction    Procedure necessitating sedation performed by:  Physician performing sedation     Intended level of sedation:  Moderate  Pre-sedation assessment:     NPO status caution: urgency dictates proceeding with non-ideal NPO status      ASA classification: class 1 - normal, healthy patient      Mallampati score:  I - soft palate, uvula, fauces, pillars visible    Pre-sedation assessments completed and reviewed: airway patency, anesthesia/sedation history, cardiovascular function, hydration status, mental status, nausea/vomiting, pain level, respiratory function and temperature      History of difficult intubation: no      Pre-sedation assessment completed:  06/04/2024 3:00 PM  Immediate pre-procedure details:     Reassessment: Patient reassessed immediately prior to procedure      Reviewed: vital signs, relevant labs/tests and NPO status      Verified: bag valve mask available, emergency equipment available, intubation equipment available, IV patency confirmed, oxygen available and suction available    Procedure details (see MAR for exact dosages):     Sedation start time:  06/04/2024 3:22 PM    Preoxygenation:  Room air    Sedation:  Propofol     Intra-procedure monitoring:  Blood pressure monitoring, cardiac monitor, frequent LOC assessments, frequent vital sign checks, continuous capnometry and continuous pulse oximetry    Intra-procedure events: none      Sedation end time:  06/04/2024 3:34 PM  Post-procedure details:     Post-sedation assessment completed:  06/04/2024 3:34 PM    Attendance: Constant attendance by certified staff until patient recovered      Recovery: Patient returned to pre-procedure baseline      Post-sedation assessments completed and reviewed: airway patency, cardiovascular function, hydration status, mental status, nausea/vomiting, pain level and respiratory function      Patient is stable for discharge or admission:  yes      Procedure completion:  Tolerated well, no immediate complications  Ortho Injury    Date/Time: 06/04/2024 3:34 PM    Performed by: Joesph Reyes BIRCH,  MD  Authorized by: Joesph Reyes BIRCH, MD  Consent: Verbal consent obtained. Written consent obtained  Risks and benefits: risks, benefits and alternatives were discussed  Consent given by: patient  Patient understanding: patient states understanding of the procedure being performed  Imaging studies: imaging studies available  Patient identity confirmed: verbally with patient and arm band  Injury location: shoulder  Location details: left shoulder  Injury type: dislocation  Dislocation type: anterior  Hill-Sachs deformity: yes  Chronicity: new  Pre-procedure distal perfusion: normal  Pre-procedure neurological function: normal  Pre-procedure range of motion: reduced  Post-procedure distal perfusion: normal  Post-procedure neurological function: normal  Post-procedure range of motion: normal                  (Please note that portions of this note were completed with a voice recognition program.  Efforts were made to edit the dictations but occasionally words are mis-transcribed.)    Reyes BIRCH Joesph, MD (electronically signed)  Emergency Attending Physician              Joesph Reyes BIRCH, MD  06/04/24 1535    "

## 2024-06-04 NOTE — ED Notes (Signed)
"  Patient passed PO challenge on small, slow water sips.  "

## 2024-06-04 NOTE — ED Triage Notes (Signed)
"  Patient reports fall in the bedroom, slipped and fell on the left arm on the wooden chest 30 min ago. Denies LOC, no blood thinner medications, no head injury. Patient reports left shoulder pain. Patient is ambulatory in triage.   "

## 2024-06-09 ENCOUNTER — Ambulatory Visit
Admit: 2024-06-09 | Discharge: 2024-06-09 | Payer: Medicare (Managed Care) | Attending: Specialist | Primary: Internal Medicine

## 2024-06-09 VITALS — Ht 61.0 in | Wt 120.0 lb

## 2024-06-09 DIAGNOSIS — S42252A Displaced fracture of greater tuberosity of left humerus, initial encounter for closed fracture: Principal | ICD-10-CM

## 2024-06-09 NOTE — Progress Notes (Signed)
 "    ASSESSMENT/PLAN     Below is the assessment and plan developed based on review of pertinent history, physical exam, labs, studies, and medications.    1. Closed displaced fracture of greater tuberosity of left humerus, initial encounter  -     CLOSED TX GR TUBEROSITY HUM FX  2. Shoulder dislocation, left, initial encounter      No follow-ups on file.     In discussion with the patient, we considered the numerus possible diagnoses that could be contributing to their present symptoms. We also deliberated on the extensive management options that must be considered to treat their current condition. We reviewed their accessible prior medical records, diagnostic tests, and current health and employment information. We considered how these symptoms were affecting the patients activities of daily living as well as employment and fitness activities. The patient had various questions regarding the possible risks, benefits, complications, morbidity and mortality regarding their diagnosis and treatment options. The patients comorbidities were considered, and I advocated that they consider maximizing lifestyle modification through nutrition and exercise to aid in addressing their symptoms. Shared decision making yielded an understanding to move forward with conservation treatment preferences. The patient expressed understanding that if conservative management fails to alleviate the present symptoms they will return to office for re-evaluation and consideration of additional diagnostic tests and potential surgical options.     In the interim, we have recommended ice, elevation, and take prescription anti-inflammatory medications along with a physician directed home exercise program. We discussed the risks and common side effects of anti-inflammatory medications and instructed the patient to discontinue the medication and contact us  if they experienced any side effects. The patient was encouraged to discuss the possible side  effects with their family physician or pharmacist prior to initiating any new medications.    We had a long discussion regarding a healthy lifestyle to support musculoskeletal well-being and the importance of preventive maintenance. We touched on individual areas of improvement with diet, exercise, sleep, and social habits. We also reviewed the circumstances surrounding the environment that they live and work which affect a wide range of health risk. We also talked about the fact that the patient's management maybe be significantly limited by social determinants of health. We considered that many patients are limited regarding financial, educational, and health insurance resources along with time constraints to reach their health goals. We considered the limited access to appropriate educational resources regarding proper nutrition and exercise as well as the economic and social support necessary to maintain health and wellbeing.     We considered the acute condition of left shoulder dislocation with associated greater tuberosity fracture that is currently posing a threat to bodily function and how it contributes to our treatment plan.  We talked about the natural history of shoulder instability with greater tuberosity fractures.  Will keep her immobilized for approximately 3 weeks and obtain 3 views of the left shoulder at her follow-up appointment.  Will start some physical therapy at that time.  We talked about the fact there is a high incidence of rotator cuff tears with shoulder dislocations in her age group.    SUBJECTIVE/OBJECTIVE     Makayla Nicholson (DOB: 05-09-1941) is a 83 y.o. female, patient,here for evaluation of the Shoulder Pain (left)  .   Patient seen today for the left shoulder.  She is right-hand dominant.  Unfortunately she sustained a fall against the counter and dislocated her left shoulder.  She was found to have a  greater tuberosity fracture.  She underwent relocation in urgent care center.  She has  been in a sling.  She does report bruising but her pain levels are low.    PHYSICAL EXAM     Upon physical examination, the patient is well developed, well nourished, alert and oriented times three, with normal mood and affect and walks with a normal gait.    Upon examination of the left shoulder, the patient sits with the scapula protracted and depressed. They are tender to palpation over the anterior aspect of the proximal humerus. They are non-tender to palpation over the neck, clavicle, scapula, and elbow. There is minor soft tissue swelling and eccymosis. The patient has limited range of motion secondary to discomfort. The patient is unable to tolerate stability testing. They have 5/5 strength at their side, and are neurovascularly intact distally. There is no erythema, warmth or skin lesions present.    On examination of the contralateral extremity, the patient is nontender to palpation and has excellent range of motion, stability and strength.    DIAGNOSTIC TEST     I have independently reviewed and interpreted the following test:    EXAM: XR SHOULDER LEFT (MIN 2 VIEWS)     INDICATION: post reduction.     COMPARISON: 1430 hours.     FINDINGS: Three views of the left shoulder demonstrate successful reduction of  the left shoulder dislocation. The greater tuberosity has been reduced. Type II  acromion is present and there is a small amount of calcium in the distal rotator  cuff.     IMPRESSION:  Acute left shoulder reduction and left greater tuberosity reduction    Allergies   Allergen Reactions    Penicillins Rash       No current outpatient medications on file.     No current facility-administered medications for this visit.       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)     Sjogren's disease        Past Surgical History:   Procedure Laterality Date    APPENDECTOMY      BREAST BIOPSY Right     >98YRS AGO, BENIGN    CESAREAN SECTION      RETINAL  DETACHMENT SURGERY      TRABECULECTOMY      UPPER GI ENDOSCOPY,BIOPSY  05/09/2020            Family History   Problem Relation Age of Onset    Breast Cancer Sister 57    Breast Cancer Sister 91       Social History     Socioeconomic History    Marital status: Married     Spouse name: Not on file    Number of children: Not on file    Years of education: Not on file    Highest education level: Not on file   Occupational History    Not on file   Tobacco Use    Smoking status: Never    Smokeless tobacco: Never   Vaping Use    Vaping status: Never Used   Substance and Sexual Activity    Alcohol use: Not Currently    Drug use: Never    Sexual activity: Not on file   Other Topics Concern    Not on file   Social History Narrative    Not on file     Social Drivers of Health     Financial  Resource Strain: Not on file   Food Insecurity: Not on file   Transportation Needs: Not on file   Physical Activity: Not on file   Stress: Not on file   Social Connections: Not on file   Intimate Partner Violence: Not on file   Housing Stability: Not on file       Review of Systems    Failed to redirect to the Timeline version of the REVFS SmartLink.    Vitals:  Ht 1.549 m (5' 1)   Wt 54.4 kg (120 lb)   BMI 22.67 kg/m    Body mass index is 22.67 kg/m.    I was present and agree with the notes added by the fellow to this patients encounter.    An electronic signature was used to authenticate this note.  -- Deward Mau, MD                                                                                                                                                                                   "

## 2024-06-30 ENCOUNTER — Ambulatory Visit
Admit: 2024-06-30 | Discharge: 2024-06-30 | Payer: Medicare (Managed Care) | Attending: Specialist | Primary: Internal Medicine

## 2024-06-30 ENCOUNTER — Ambulatory Visit: Admit: 2024-06-30 | Payer: Medicare (Managed Care) | Primary: Internal Medicine

## 2024-06-30 VITALS — Ht 61.0 in | Wt 120.0 lb

## 2024-06-30 DIAGNOSIS — S42252A Displaced fracture of greater tuberosity of left humerus, initial encounter for closed fracture: Principal | ICD-10-CM

## 2024-06-30 NOTE — Progress Notes (Signed)
 "    ASSESSMENT/PLAN     Below is the assessment and plan developed based on review of pertinent history, physical exam, labs, studies, and medications.    1. Closed displaced fracture of greater tuberosity of left humerus, initial encounter  -     XR SHOULDER LEFT (MIN 2 VIEWS); Future  -     Amb External Referral To Physical Therapy  2. Shoulder dislocation, left, initial encounter  -     XR SHOULDER LEFT (MIN 2 VIEWS); Future  -     Amb External Referral To Physical Therapy      Return in about 4 weeks (around 07/28/2024).     In discussion with the patient, we considered the numerus possible diagnoses that could be contributing to their present symptoms. We also deliberated on the extensive management options that must be considered to treat their current condition. We reviewed their accessible prior medical records, diagnostic tests, and current health and employment information. We considered how these symptoms were affecting the patients activities of daily living as well as employment and fitness activities. The patient had various questions regarding the possible risks, benefits, complications, morbidity and mortality regarding their diagnosis and treatment options. The patients comorbidities were considered, and I advocated that they consider maximizing lifestyle modification through nutrition and exercise to aid in addressing their symptoms. Shared decision making yielded an understanding to move forward with conservation treatment preferences. The patient expressed understanding that if conservative management fails to alleviate the present symptoms they will return to office for re-evaluation and consideration of additional diagnostic tests and potential surgical options.     In the interim, we have recommended ice, elevation, and take prescription anti-inflammatory medications along with a physician directed home exercise program. We discussed the risks and common side effects of anti-inflammatory  medications and instructed the patient to discontinue the medication and contact us  if they experienced any side effects. The patient was encouraged to discuss the possible side effects with their family physician or pharmacist prior to initiating any new medications.    We had a long discussion regarding a healthy lifestyle to support musculoskeletal well-being and the importance of preventive maintenance. We touched on individual areas of improvement with diet, exercise, sleep, and social habits. We also reviewed the circumstances surrounding the environment that they live and work which affect a wide range of health risk. We also talked about the fact that the patient's management maybe be significantly limited by social determinants of health. We considered that many patients are limited regarding financial, educational, and health insurance resources along with time constraints to reach their health goals. We considered the limited access to appropriate educational resources regarding proper nutrition and exercise as well as the economic and social support necessary to maintain health and wellbeing.     We considered the acute condition of left shoulder dislocation with associated greater tuberosity fracture that is currently posing a threat to bodily function and how it contributes to our treatment plan.  We talked about the natural history of shoulder instability with greater tuberosity fractures.  We talked about the fact that her fracture had migrated slightly posteriorly and superiorly concerning for displacement.  I would like her to continue to immobilize in a sling and she will start some formal physical therapy and [place] when she travels.  I have indicated with her son as well.    SUBJECTIVE/OBJECTIVE     Makayla Nicholson (DOB: 09-Aug-1940) is a 83 y.o. female, patient,here for evaluation  of the Follow-up (3 week left shoulder follow up, x-ray check)  .   Patient seen today for the left shoulder.  She is  right-hand dominant.  Unfortunately she sustained a fall against the counter and dislocated her left shoulder.  She was found to have a greater tuberosity fracture.  She underwent relocation in urgent care center.  She has been in a sling.  She does report bruising but her pain levels are low.    PHYSICAL EXAM     Upon physical examination, the patient is well developed, well nourished, alert and oriented times three, with normal mood and affect and walks with a normal gait.    Upon examination of the left shoulder, the patient sits with the scapula protracted and depressed. They are tender to palpation over the anterior aspect of the proximal humerus. They are non-tender to palpation over the neck, clavicle, scapula, and elbow. There is minor soft tissue swelling and eccymosis. The patient has limited range of motion secondary to discomfort. The patient is unable to tolerate stability testing. They have 5/5 strength at their side, and are neurovascularly intact distally. There is no erythema, warmth or skin lesions present.    On examination of the contralateral extremity, the patient is nontender to palpation and has excellent range of motion, stability and strength.    DIAGNOSTIC TEST     I have independently reviewed and interpreted the following test:    3 views of the left shoulder show evidence of interval displacement of her greater tuberosity fracture compared to her prior x-rays.    Allergies   Allergen Reactions    Penicillins Rash       No current outpatient medications on file.     No current facility-administered medications for this visit.       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)     Sjogren's disease        Past Surgical History:   Procedure Laterality Date    APPENDECTOMY      BREAST BIOPSY Right     >32YRS AGO, BENIGN    CESAREAN SECTION      RETINAL DETACHMENT SURGERY      TRABECULECTOMY      UPPER GI ENDOSCOPY,BIOPSY  05/09/2020             Family History   Problem Relation Age of Onset    Breast Cancer Sister 32    Breast Cancer Sister 30       Social History     Socioeconomic History    Marital status: Married     Spouse name: Not on file    Number of children: Not on file    Years of education: Not on file    Highest education level: Not on file   Occupational History    Not on file   Tobacco Use    Smoking status: Never    Smokeless tobacco: Never   Vaping Use    Vaping status: Never Used   Substance and Sexual Activity    Alcohol use: Not Currently    Drug use: Never    Sexual activity: Not on file   Other Topics Concern    Not on file   Social History Narrative    Not on file     Social Drivers of Health     Financial Resource Strain: Not on file   Food Insecurity: Not on file  Transportation Needs: Not on file   Physical Activity: Not on file   Stress: Not on file   Social Connections: Not on file   Intimate Partner Violence: Not on file   Housing Stability: Not on file       Review of Systems    Failed to redirect to the Timeline version of the REVFS SmartLink.    Vitals:  Ht 1.549 m (5' 1)   Wt 54.4 kg (120 lb)   BMI 22.67 kg/m    Body mass index is 22.67 kg/m.    I was present and agree with the notes added by the fellow to this patients encounter.    An electronic signature was used to authenticate this note.  -- Deward Mau, MD                                                                                                                                                                                   "
